# Patient Record
Sex: Male | Born: 1960 | Race: White | Hispanic: Yes | Marital: Married | Smoking: Never smoker
Health system: Southern US, Community
[De-identification: ages and names within clinical notes are randomized; demographics above are authoritative.]

## PROBLEM LIST (undated history)

## (undated) DIAGNOSIS — K469 Unspecified abdominal hernia without obstruction or gangrene: Secondary | ICD-10-CM

## (undated) DIAGNOSIS — I10 Essential (primary) hypertension: Secondary | ICD-10-CM

---

## 1998-05-24 ENCOUNTER — Ambulatory Visit (HOSPITAL_COMMUNITY): Admission: RE | Admit: 1998-05-24 | Discharge: 1998-05-24 | Payer: Self-pay | Admitting: Gastroenterology

## 2001-02-26 ENCOUNTER — Ambulatory Visit (HOSPITAL_COMMUNITY): Admission: RE | Admit: 2001-02-26 | Discharge: 2001-02-26 | Payer: Self-pay | Admitting: Gastroenterology

## 2004-08-30 ENCOUNTER — Encounter: Admission: RE | Admit: 2004-08-30 | Discharge: 2004-08-30 | Payer: Self-pay | Admitting: Internal Medicine

## 2004-10-11 ENCOUNTER — Encounter: Admission: RE | Admit: 2004-10-11 | Discharge: 2004-10-11 | Payer: Self-pay | Admitting: Internal Medicine

## 2004-10-18 ENCOUNTER — Encounter: Admission: RE | Admit: 2004-10-18 | Discharge: 2004-10-18 | Payer: Self-pay | Admitting: Internal Medicine

## 2004-11-08 ENCOUNTER — Encounter: Admission: RE | Admit: 2004-11-08 | Discharge: 2004-11-08 | Payer: Self-pay | Admitting: Internal Medicine

## 2005-04-03 ENCOUNTER — Encounter: Admission: RE | Admit: 2005-04-03 | Discharge: 2005-04-03 | Payer: Self-pay | Admitting: Internal Medicine

## 2006-01-22 IMAGING — US IR TRANSCATH EMBOLIZATION NON-NEURO EA OP FIELD
1 series · 2 of 2 positions shown · non-contrast
Comparison: none

CLINICAL DATA: Chronic right GSV venous insufficiency and reflux with chronic venous disease resulting in varicose veins, lower extremity edema, and tibial healing venous ulceration.  
ULTRASOUND GUIDED RIGHT GSV TRANSCATHETER LASER OCCLUSION:
Radiologist:  Shantel Casillas, M.D.
Guidance:  Ultrasound. 
Complications:  No immediate complications. 
Procedure/findings:  Informed consent was obtained from the patient. 
Survey ultrasound was performed for planning with the patient supine of the right lower extremity.  The entry site was localized to the lower calf region.  The right extremity was prepped and draped in the usual fashion from the inguinal ligament to the ankle.  
Under sterile conditions and local anesthesia, micropuncture needle access was performed at the right GSV along the lower most varicose venous tributary just above the ankle.  An .018 guide wire easily advanced into the right GSV along the calf.  4 French dilator was inserted followed by a straight tip .035 floppy guide wire advanced across the right SFJ without difficulty.  The 4 French delivery sheath was advanced over the guide wire and positioned 15mm inferior to the right SFJ.  Catheter position was confirmed with ultrasound.  The laser fiber was advanced through the sheath but not yet exposed to the native vein wall.  Next, local tumescent anesthesia was applied to the right GSV segment with the tumescent delivery system pump.  488cc of dilute 1% Lidocaine was instilled over the treated segment which measures 60cm.  Following anesthesia, resurvey of the segment demonstrated adequate insulation and collapse and the segment was 10mm deep to the skin.  For treatment, the laser fiber was exposed to the native vein wall and reconfirmed to be 15mm inferior to the right SFJ.  The laser fiber was enabled and 2641.3 joules of energy were delivered over 308 seconds for treatment.  The sheath and laser were removed.  Hemostasis was obtained with compression.  The patient tolerated the procedure well.  The patient was placed in a 20 to 30mm compression stocking and ambulated for 10 minutes.  He was stable for discharge.  The patient tolerated the procedure well.  There were no immediate complications.

[Series 1: unknown · 0.09mm/px · 2 of 2 slices shown]
[im 1/2]
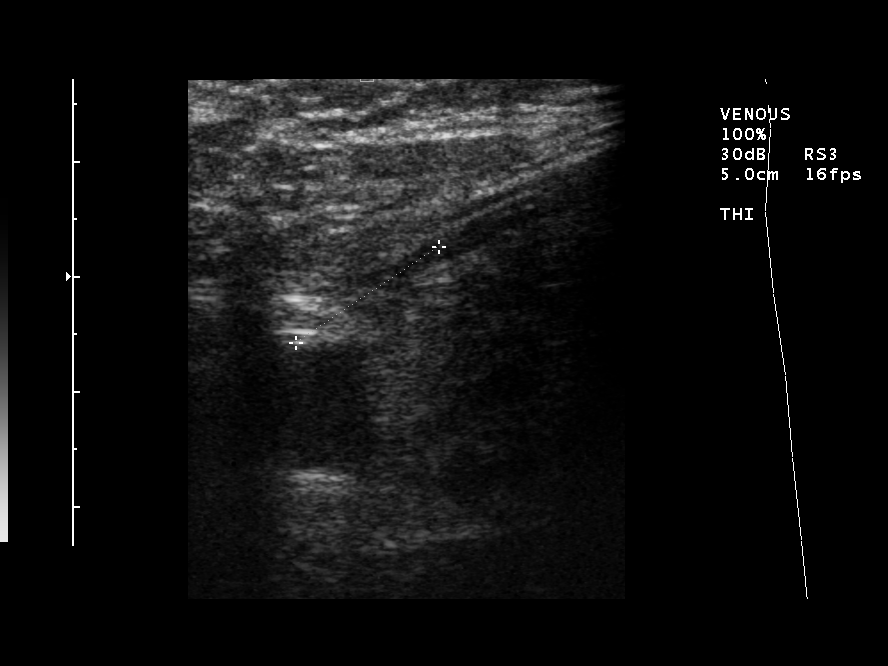
[im 2/2]
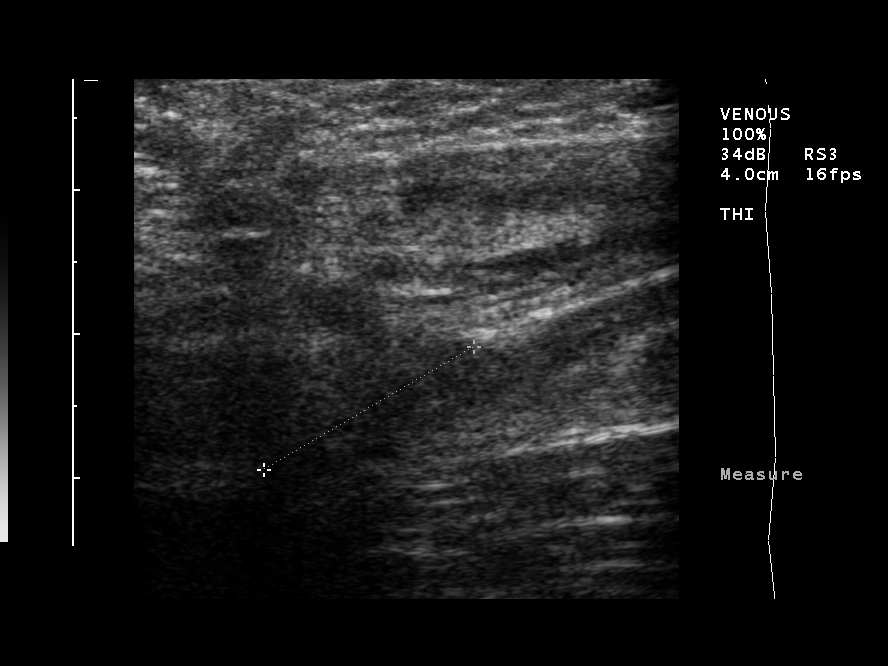

[2 of 2 positions shown; findings below may reference images not displayed]

IMPRESSION: Ultrasound guided right GSV transcatheter laser occlusion for treatment of chronic GSV venous insufficiency resulting in varicose veins, lower extremity edema, and healing venous ulceration. 
Plan:  The patient will return in one week for follow up.

## 2006-01-29 IMAGING — US EM EST PATIENT OFFICE LEVEL 3 (15 MIN)
1 series · 13 of 16 positions shown · non-contrast
Comparison: none

CLINICAL DATA: 44 year old male one week status post right GSV transcatheter laser occlusion for venous insufficiency.

[Series 1: unknown · 13 of 23 slices shown]
[im 1/23]
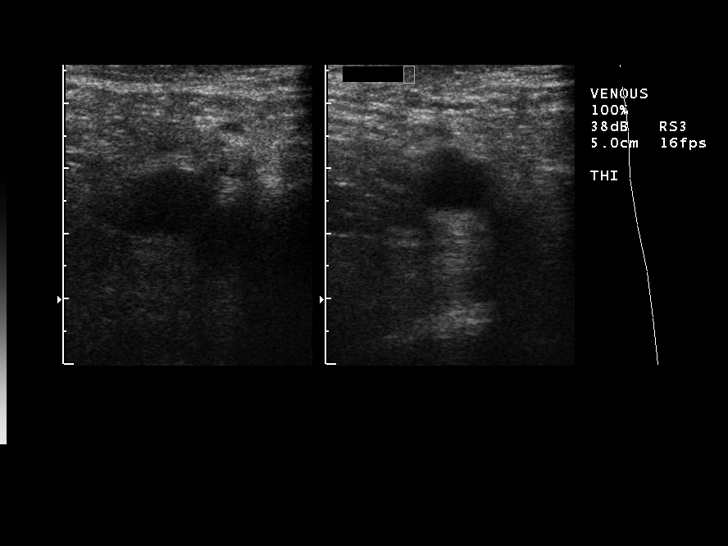
[im 2/23]
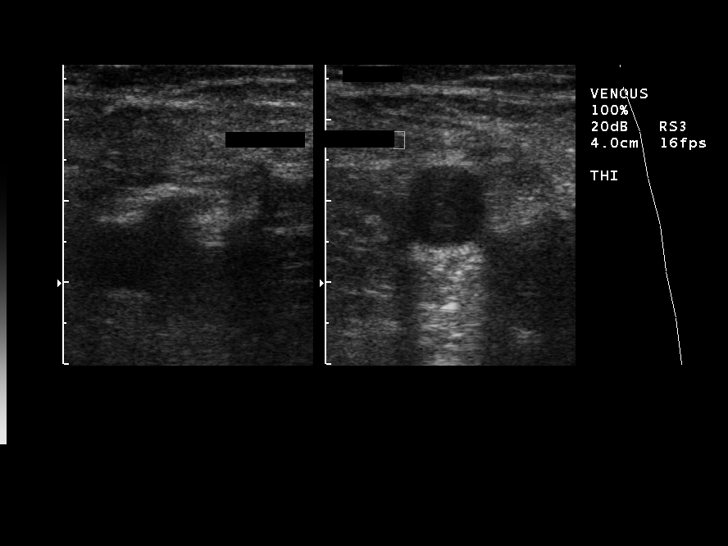
[im 5/23]
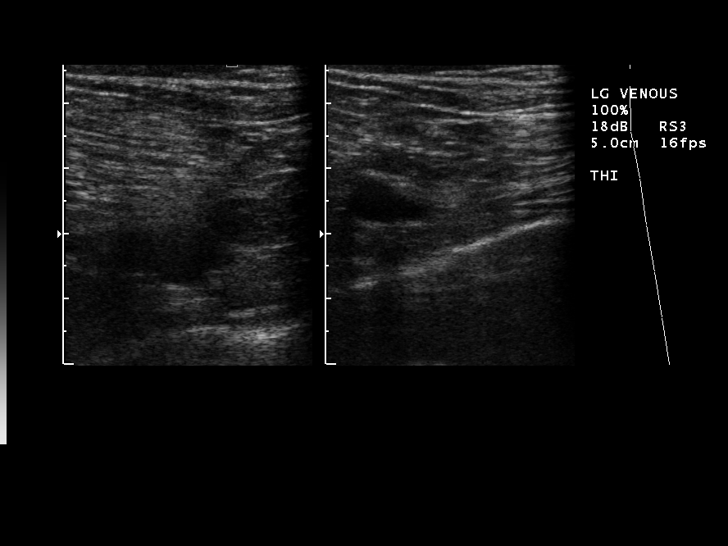
[im 6/23]
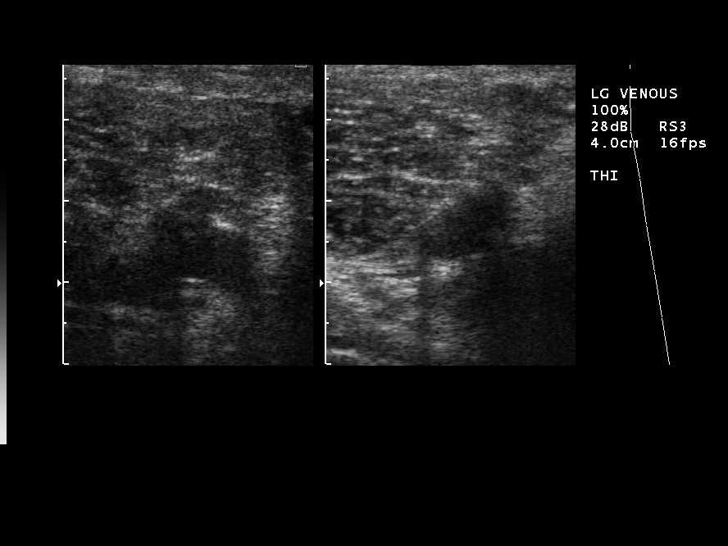
[im 8/23]
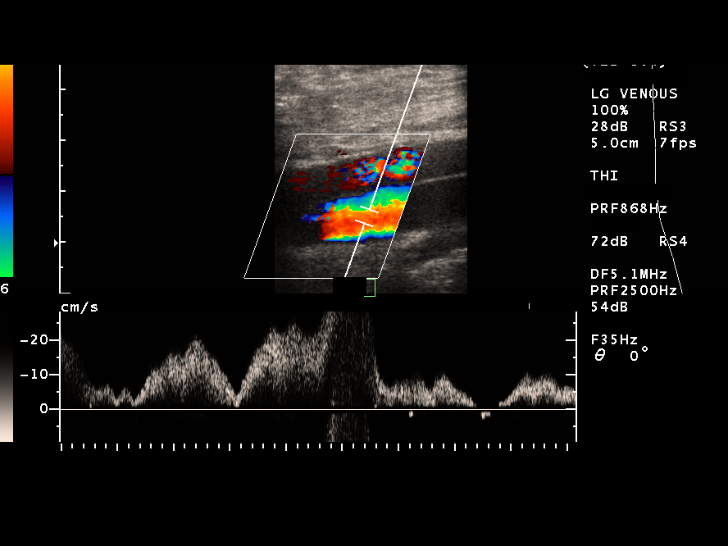
[im 9/23]
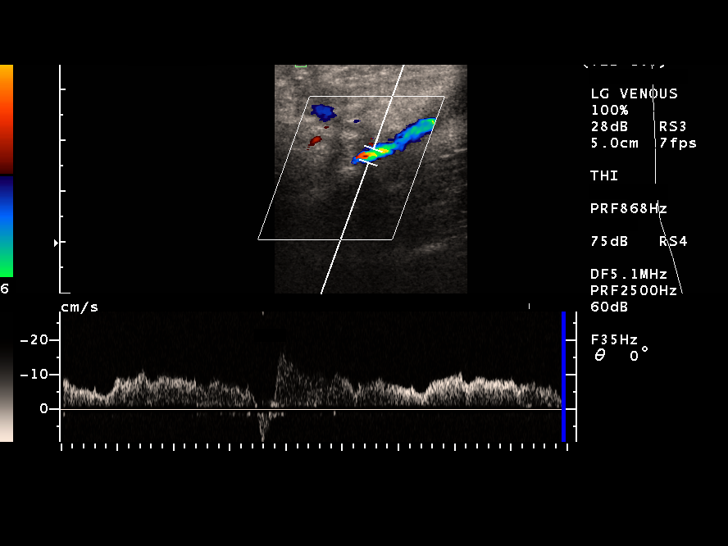
[im 12/23]
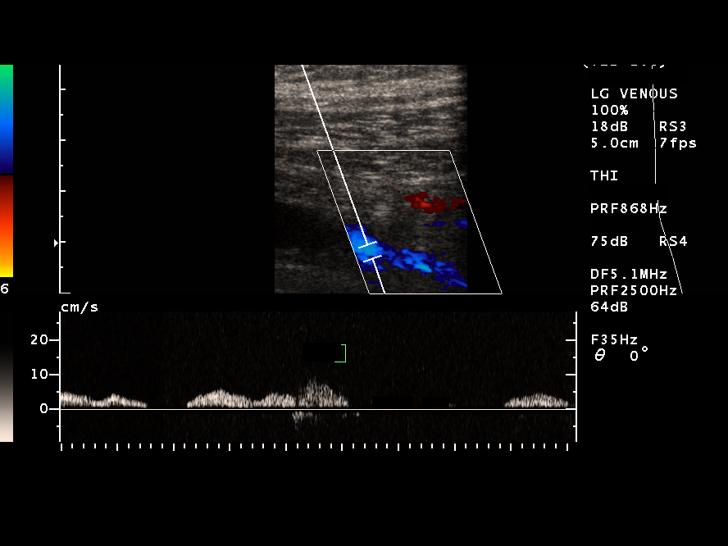
[im 14/23]
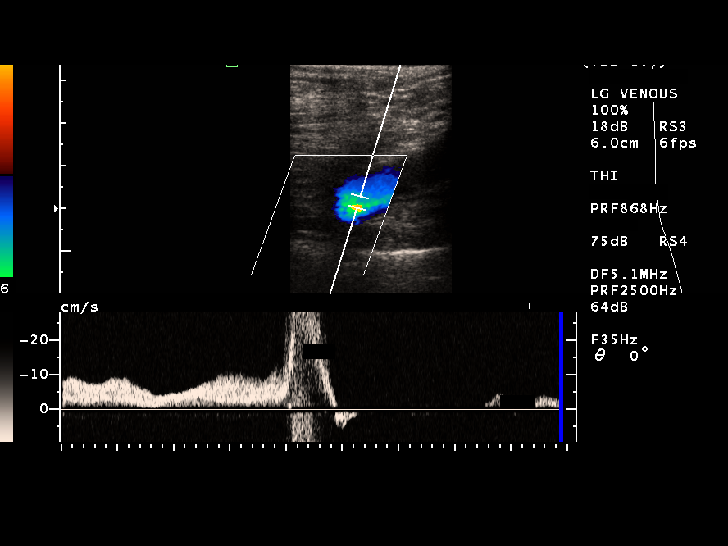
[im 15/23]
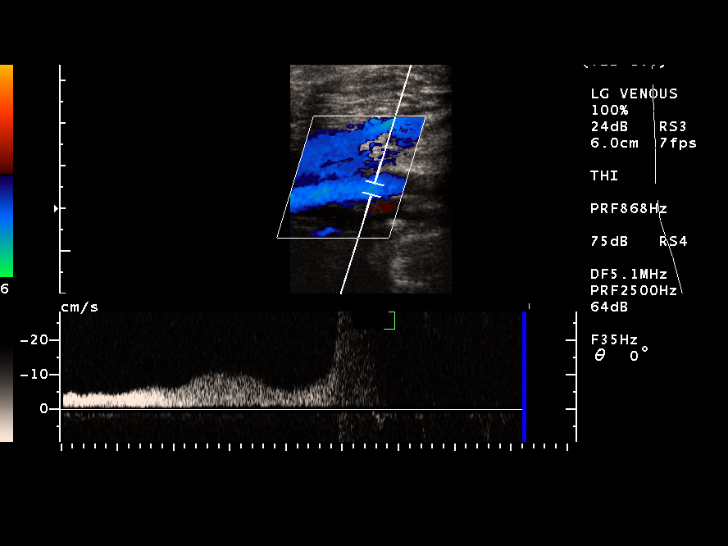
[im 17/23]
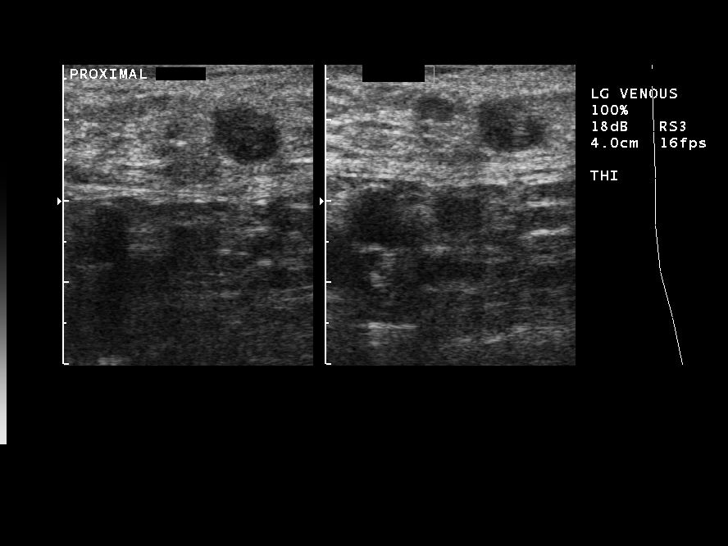
[im 18/23]
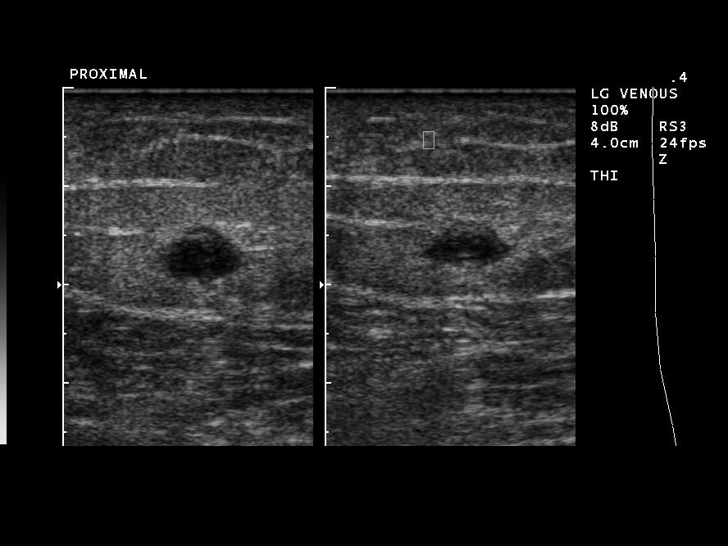
[im 21/23]
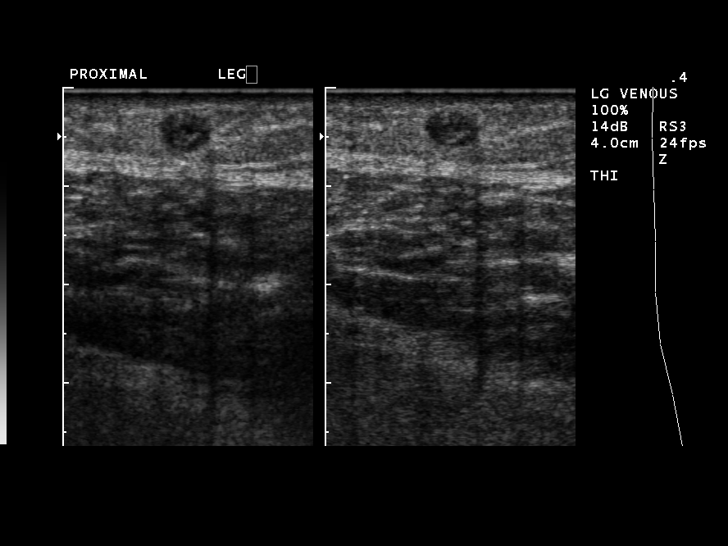
[im 23/23]
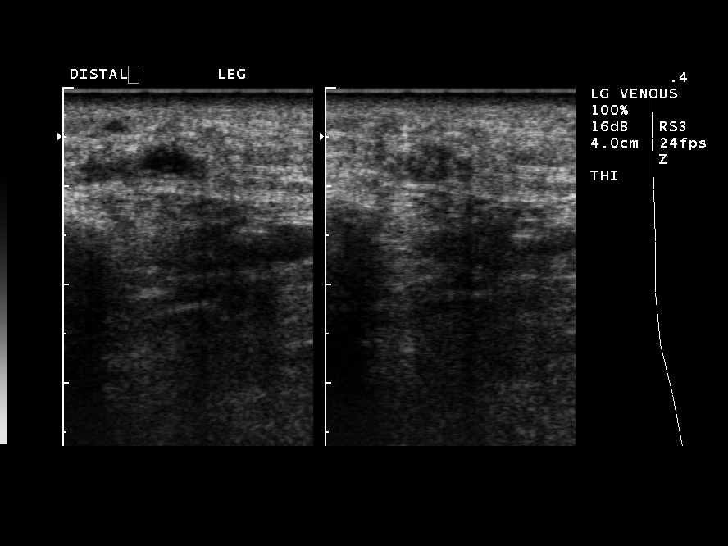

[13 of 16 positions shown; findings below may reference images not displayed]

ESTABLISHED PATIENT OFFICE VISIT ? LEVEL III ? [id number]
Today, Mr. Jalloh returns for a one week follow up visit after right GSV transcatheter laser occlusion on 10/11/04.  Over the last week he has done quite well.  He continues to wear his compression stocking.  He reports mild resolving tenderness and pain along the treated segment as would be expected at this time.  
On exam, there is minor bruising along the treated segment along the inner thigh and calf region.  There is mild tenderness focally over the incision site where there is thrombosis of the treated right GSV in the lower calf region.  This appears to be a mild associated thrombophlebitis in this region.  No definite cellulitis at this time.  The treated segment remains occluded from just inferior to the right SFJ to the lower calf region.  Overlying skin is intact.  No delayed complication.
IMPRESSION: 1.  One week status post right GSV transcatheter laser occlusion without delayed complication. 
2.  Suspect mild thrombophlebitis of the lower calf region along the treated segment.  This will be treated with seven days of Ibuprofen 600mg PO tid with meals.  
3.  He has a follow up visit in one month.

## 2006-02-19 IMAGING — US US MFM FOLLOW-UP FOCUS VISIT
1 series · 13 of 27 positions shown · non-contrast
Comparison: none

[Series 1: unknown · 13 of 27 slices shown]
[im 2/27]
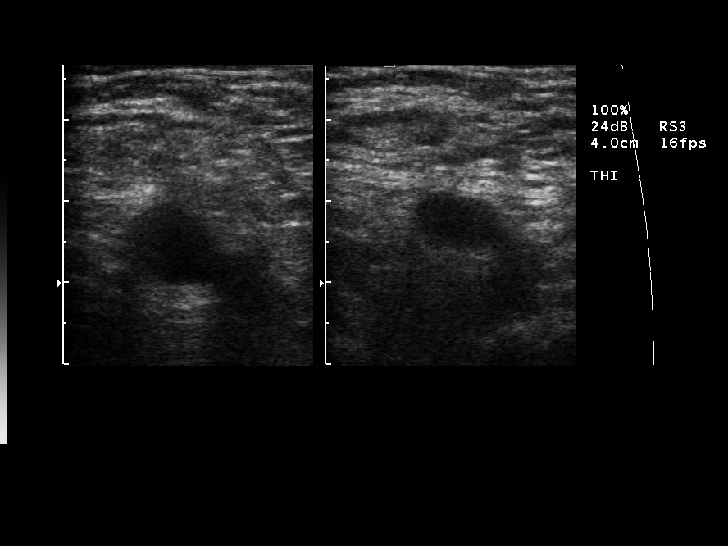
[im 4/27]
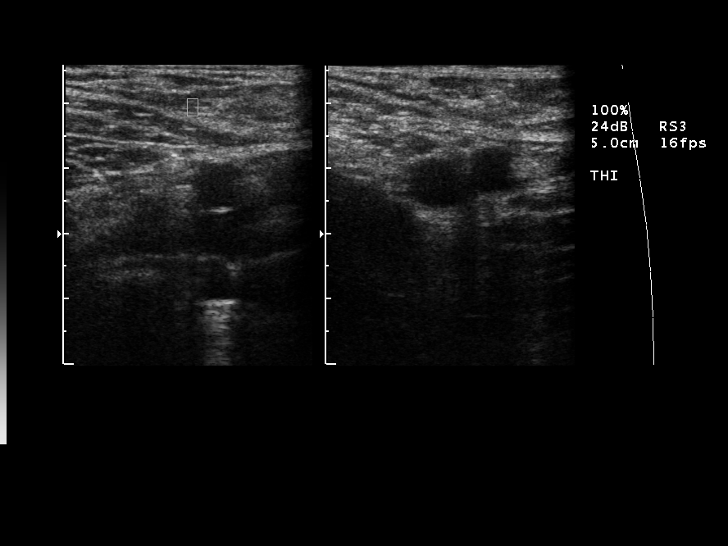
[im 6/27]
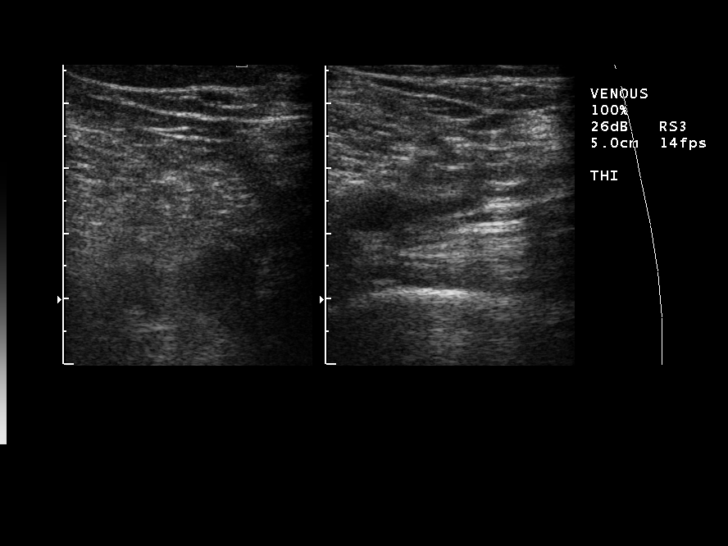
[im 8/27]
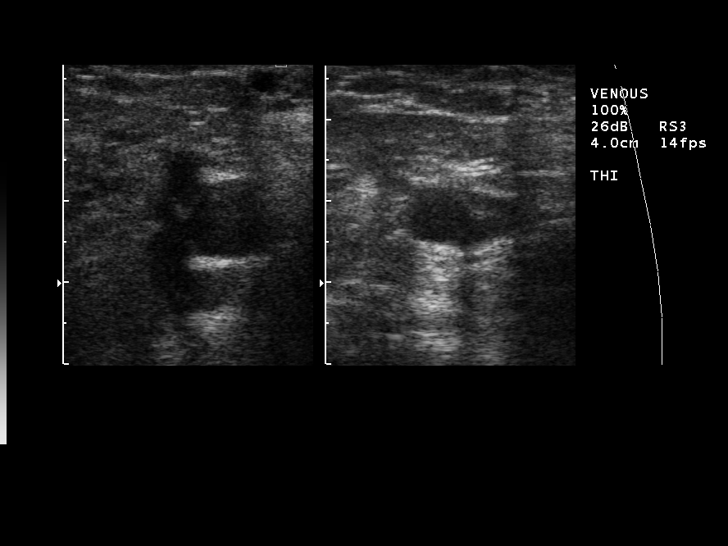
[im 10/27]
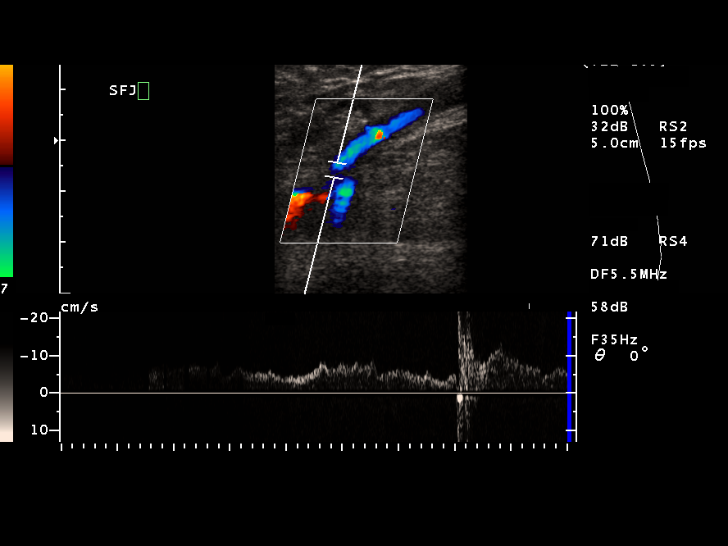
[im 12/27]
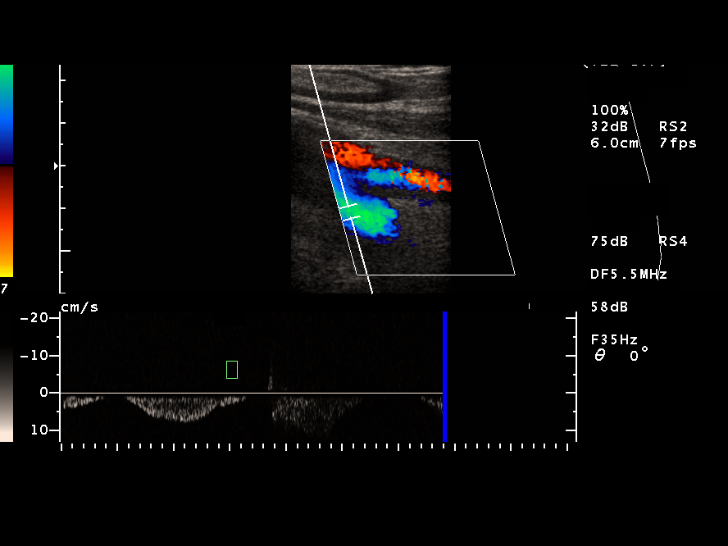
[im 14/27]
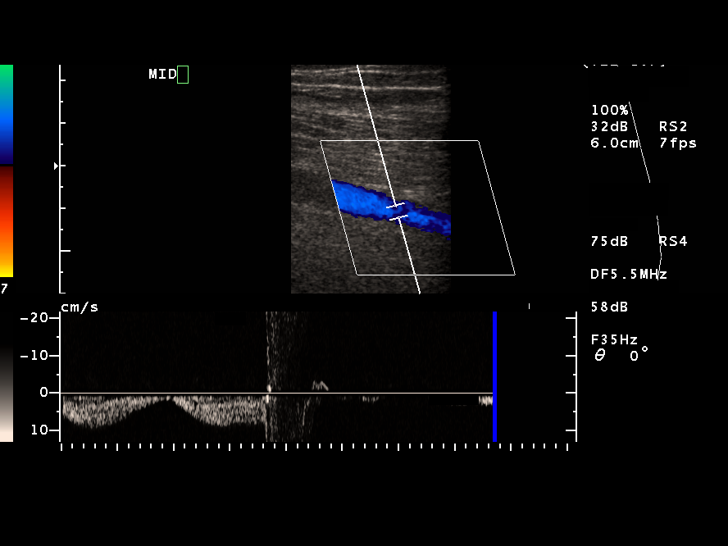
[im 16/27]
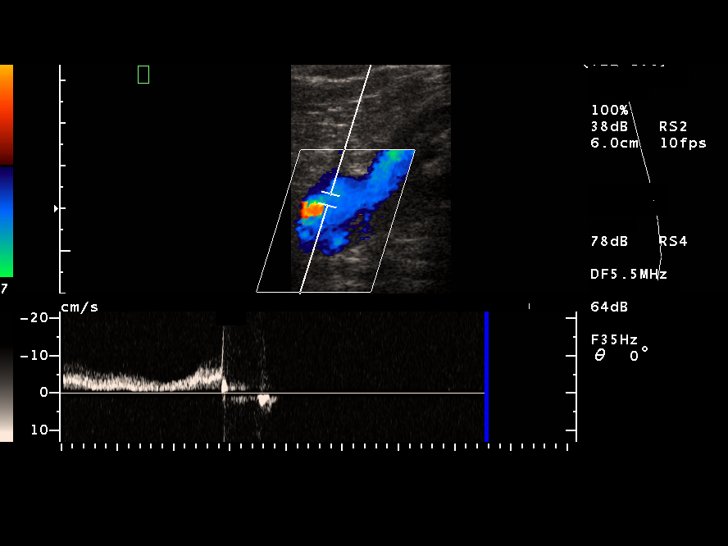
[im 18/27]
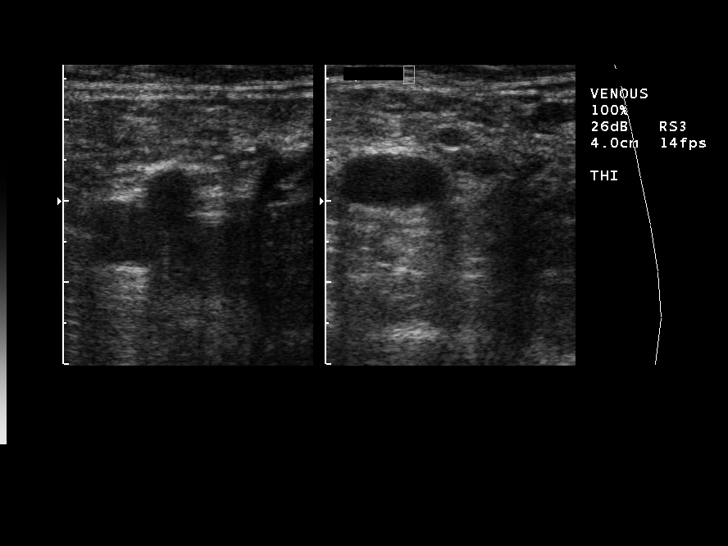
[im 20/27]
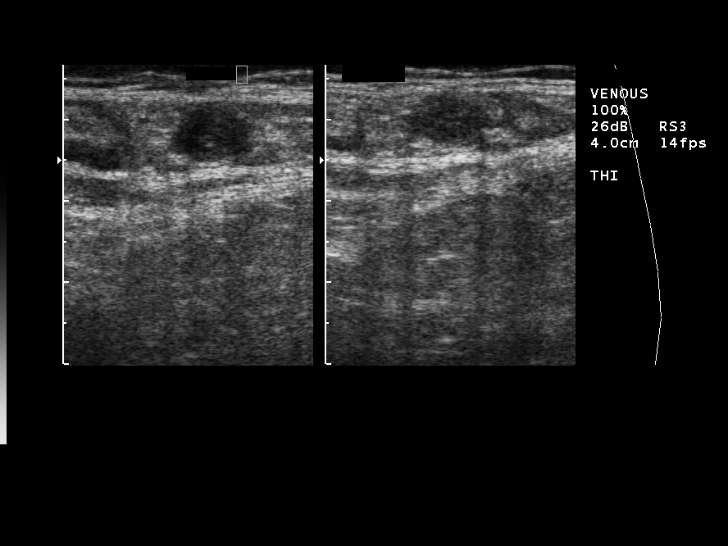
[im 22/27]
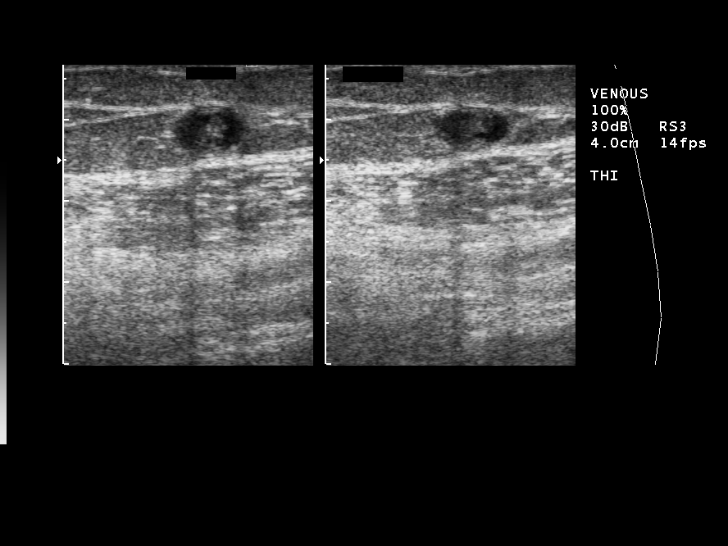
[im 24/27]
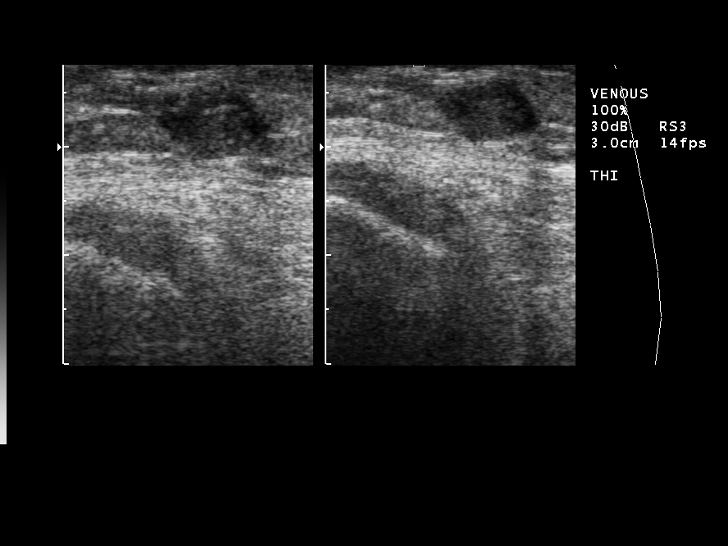
[im 26/27]
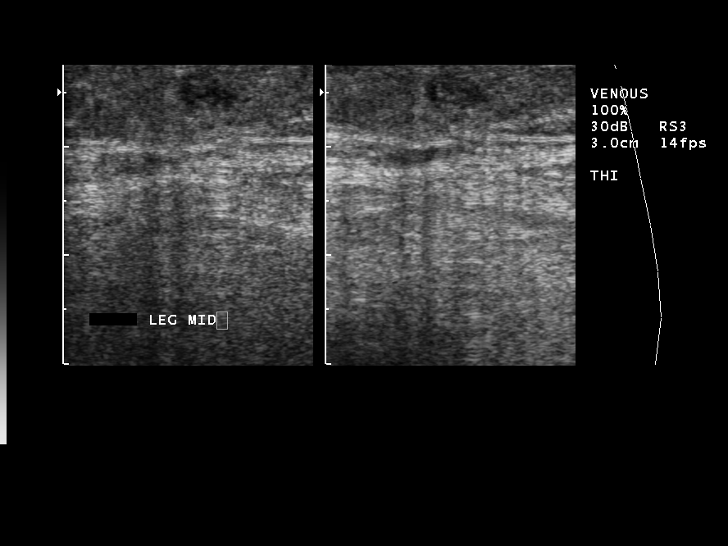

[13 of 27 positions shown; findings below may reference images not displayed]

Established patient office visit [id number]:

This 44 year old male, patient of Dr. Agirre, is seen one month status post
transcatheter laser occlusion of the right greater saphenous vein for
symptomatic varicose veins and leg swelling. He describes some improvement in
his leg swelling and pain although he does note that the symptoms persist after
a day of work despite use of graduated compression stockings. No new symptoms.
No significant pain along the treatment site.

Right leg venous ultrasound shows technically successful occlusion of the right
greater saphenous vein. No DVT or other apparent complication.

On exam, interval resolution of the bruising along the treatment course. Skin
entry site is clean, dry, intact. There is a palpable cord along the course of
the treated segment. There is persistent 1+ pretibial edema and swelling of the
right leg below the knee compared to the left. There has been partial healing of
the right leg ulcer since his initial presentation. 

My impression is that he is advancing as expected one month status post
transcatheter laser occlusion of the right greater saphenous vein. We discussed
expectation of further resolution of the palpable cord along the treatment site
and continued improvement in his right lower extremity symptoms. He will
continue the use of graduated compression stockings. We will see him back at the
a six-month followup. He knows to call should he have any interval questions or
problems.

## 2006-07-15 IMAGING — US US EXTREM LOW VENOUS*R*
1 series · 14 of 24 positions shown · non-contrast
Comparison: 11/08/04.

CLINICAL DATA: 6 month followup right GSV transcatheter laser occlusion. 
RIGHT LOWER EXTREMITY VENOUS DOPPLER ULTRASOUND:
TECHNIQUE: Gray-scale sonography with compression, as well as color and duplex Doppler ultrasound, were performed to evaluate the deep venous system from the level of the common femoral vein through the popliteal and proximal calf veins.

[Series 1: unknown · 14 of 27 slices shown]
[im 1/27]
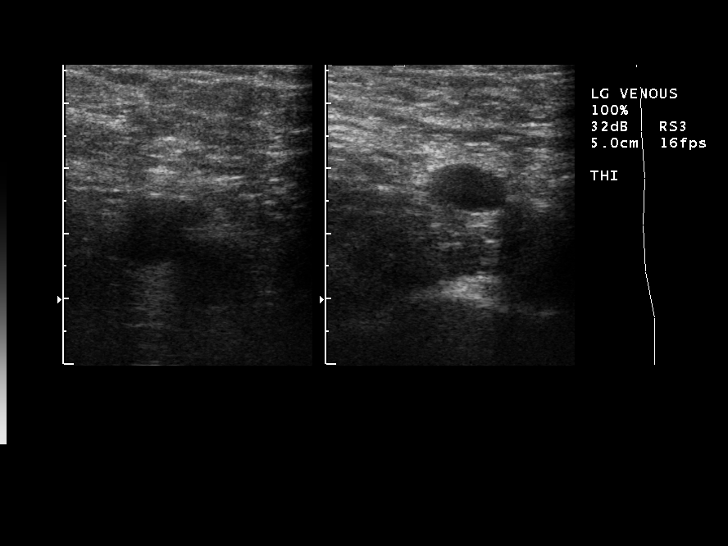
[im 3/27]
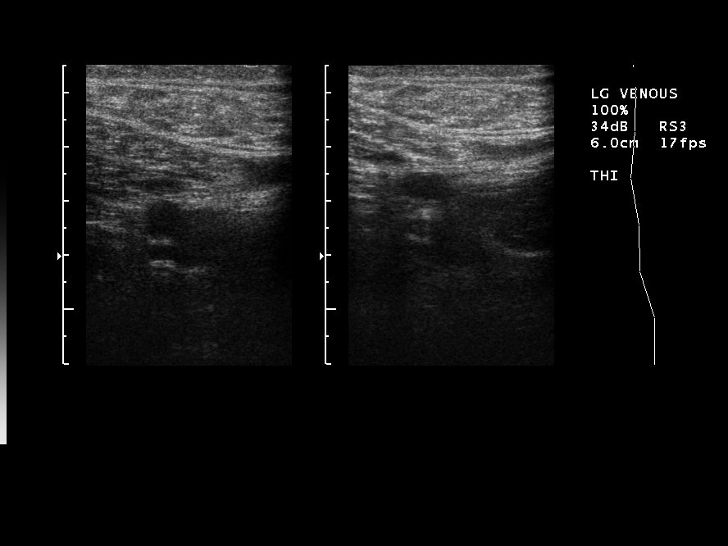
[im 5/27]
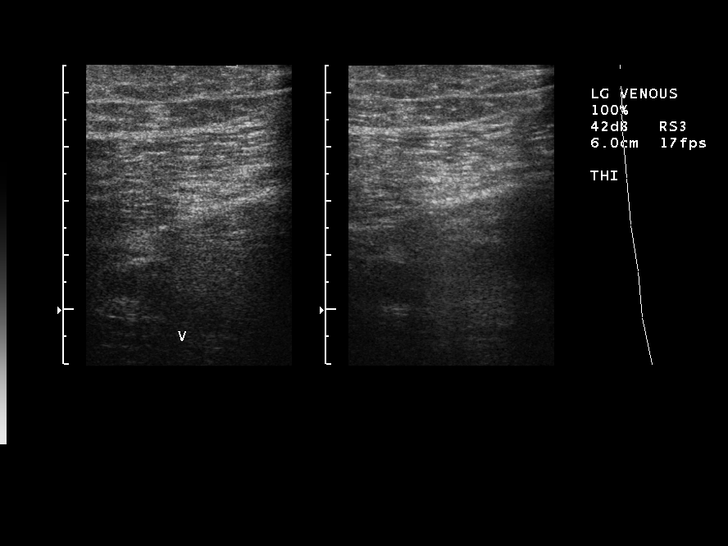
[im 7/27]
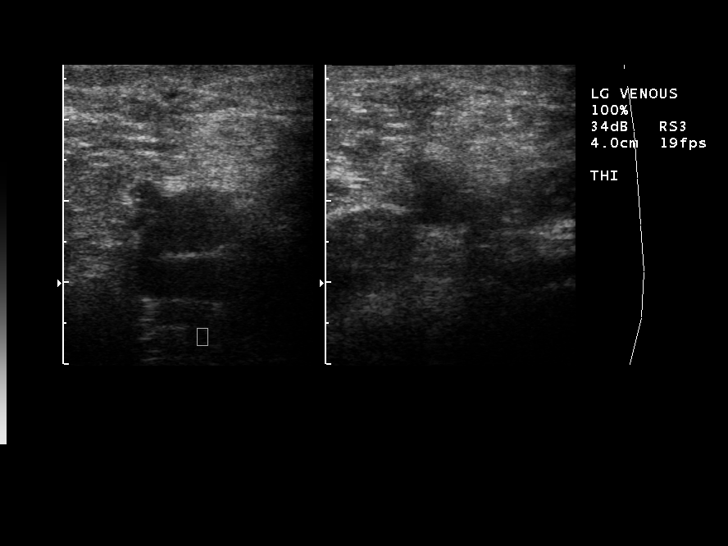
[im 8/27]
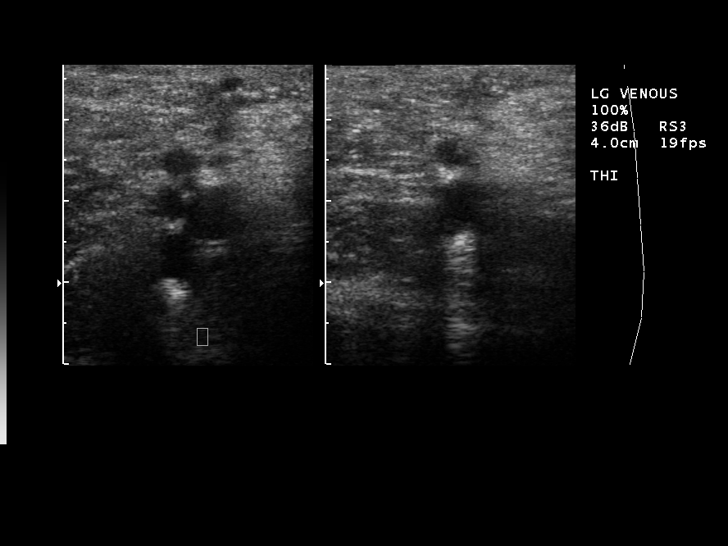
[im 11/27]
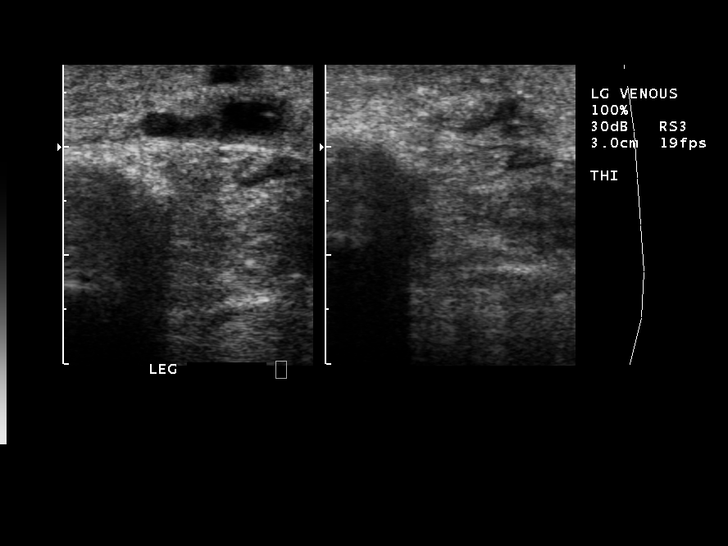
[im 13/27]
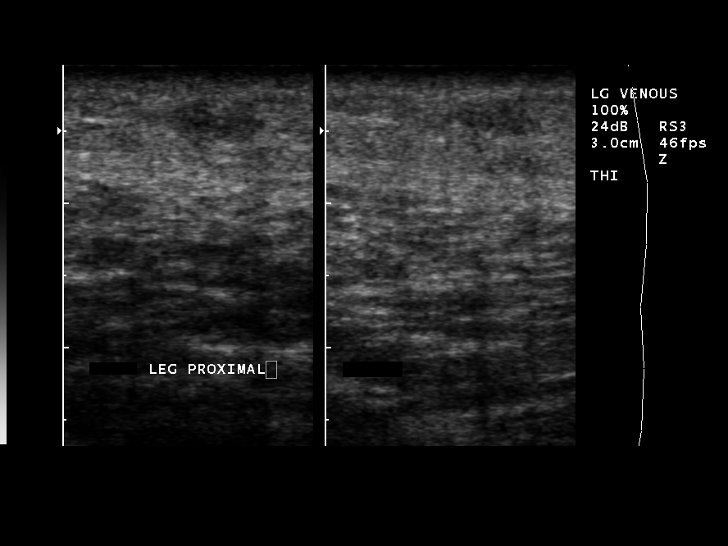
[im 14/27]
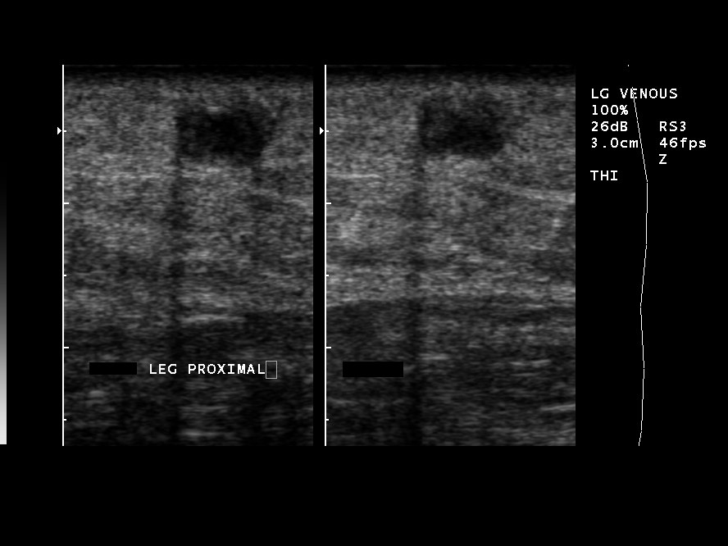
[im 16/27]
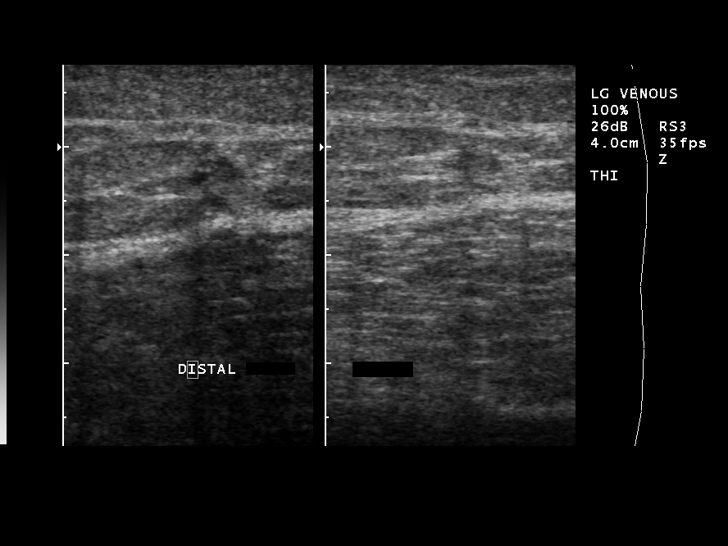
[im 19/27]
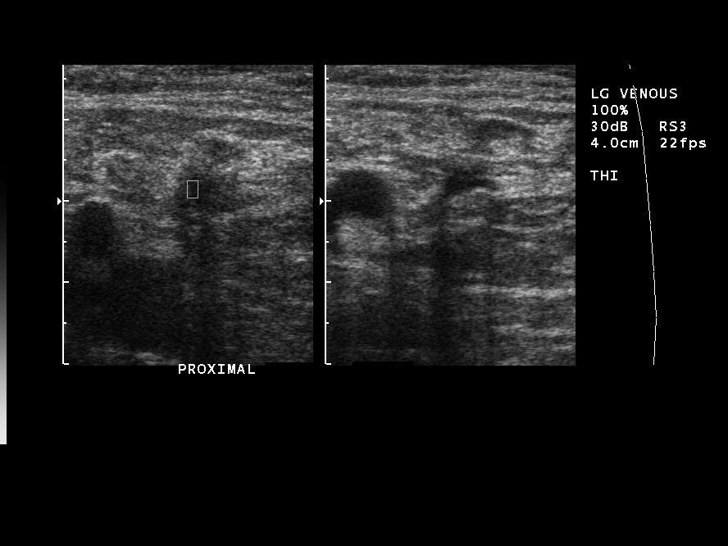
[im 21/27]
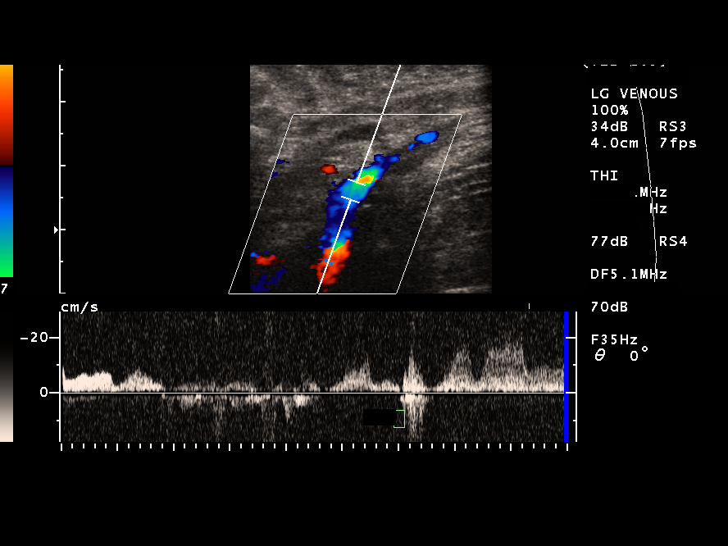
[im 22/27]
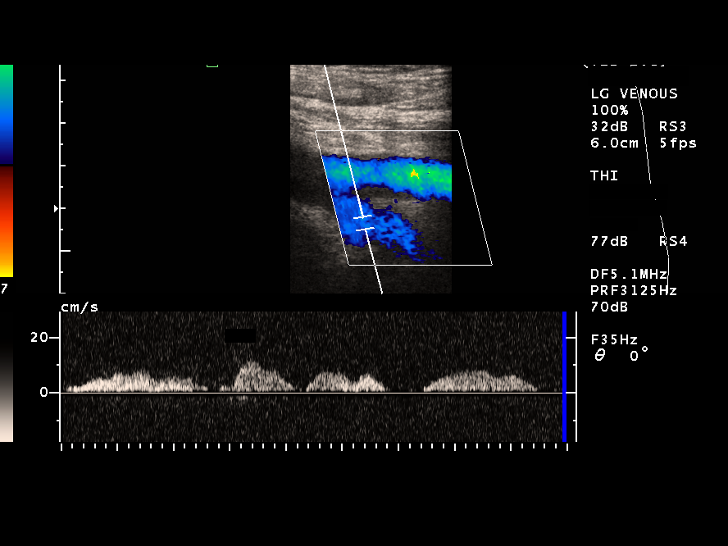
[im 24/27]
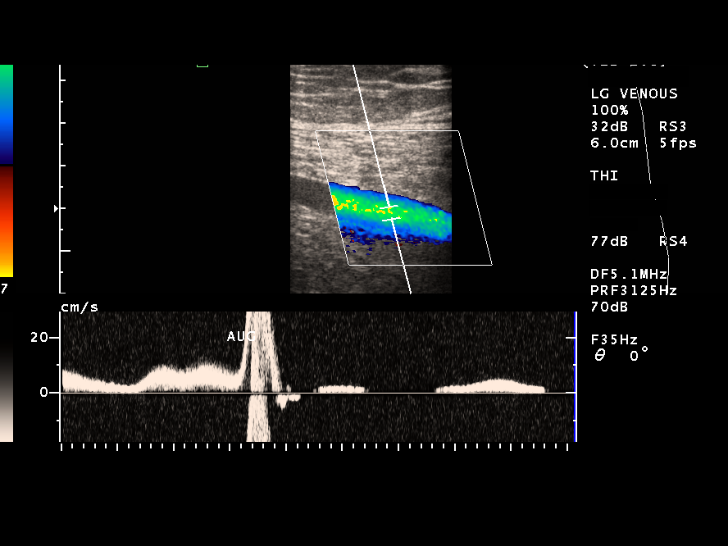
[im 27/27]
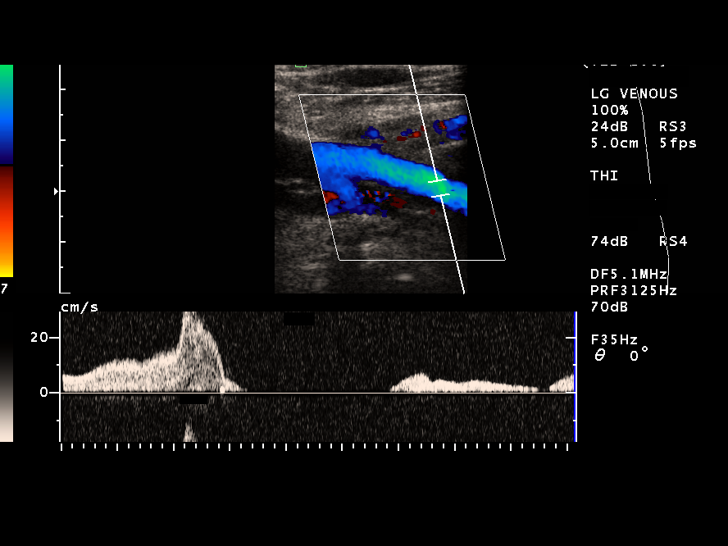

[14 of 24 positions shown; findings below may reference images not displayed]

FINDINGS: The right common femoral, femoral, and popliteal veins demonstrate normal compressibility and augmentation without DVT.  The treated segment of the right GSV remains occluded from just inferior to the right SFJ to the lower calf region.  Exam is stable in appearance.
IMPRESSION: 1.  Right GSV treated segment remains occluded.  No evidence of recanalization. 
2.  No DVT.

## 2010-02-04 ENCOUNTER — Encounter: Payer: Self-pay | Admitting: Family Medicine

## 2018-11-02 ENCOUNTER — Other Ambulatory Visit: Payer: Self-pay

## 2018-11-02 DIAGNOSIS — Z20822 Contact with and (suspected) exposure to covid-19: Secondary | ICD-10-CM

## 2018-11-04 LAB — NOVEL CORONAVIRUS, NAA: SARS-CoV-2, NAA: NOT DETECTED

## 2019-01-25 ENCOUNTER — Ambulatory Visit: Payer: Self-pay | Attending: Internal Medicine

## 2019-01-25 DIAGNOSIS — Z20822 Contact with and (suspected) exposure to covid-19: Secondary | ICD-10-CM

## 2019-01-26 LAB — NOVEL CORONAVIRUS, NAA: SARS-CoV-2, NAA: NOT DETECTED

## 2022-08-18 ENCOUNTER — Encounter (HOSPITAL_BASED_OUTPATIENT_CLINIC_OR_DEPARTMENT_OTHER): Payer: Self-pay

## 2022-08-18 ENCOUNTER — Observation Stay (HOSPITAL_BASED_OUTPATIENT_CLINIC_OR_DEPARTMENT_OTHER)
Admission: EM | Admit: 2022-08-18 | Discharge: 2022-08-20 | Disposition: A | Discharge status: Home / Self Care | Payer: BLUE CROSS/BLUE SHIELD | Attending: Surgery | Admitting: Surgery

## 2022-08-18 ENCOUNTER — Emergency Department (HOSPITAL_BASED_OUTPATIENT_CLINIC_OR_DEPARTMENT_OTHER): Payer: BLUE CROSS/BLUE SHIELD

## 2022-08-18 ENCOUNTER — Other Ambulatory Visit: Payer: Self-pay

## 2022-08-18 DIAGNOSIS — K8000 Calculus of gallbladder with acute cholecystitis without obstruction: Secondary | ICD-10-CM | POA: Diagnosis present

## 2022-08-18 DIAGNOSIS — K8012 Calculus of gallbladder with acute and chronic cholecystitis without obstruction: Secondary | ICD-10-CM | POA: Diagnosis not present

## 2022-08-18 DIAGNOSIS — I10 Essential (primary) hypertension: Secondary | ICD-10-CM | POA: Insufficient documentation

## 2022-08-18 DIAGNOSIS — K8020 Calculus of gallbladder without cholecystitis without obstruction: Principal | ICD-10-CM

## 2022-08-18 DIAGNOSIS — K810 Acute cholecystitis: Secondary | ICD-10-CM | POA: Diagnosis present

## 2022-08-18 DIAGNOSIS — R1011 Right upper quadrant pain: Secondary | ICD-10-CM | POA: Diagnosis present

## 2022-08-18 HISTORY — DX: Unspecified abdominal hernia without obstruction or gangrene: K46.9

## 2022-08-18 HISTORY — DX: Essential (primary) hypertension: I10

## 2022-08-18 LAB — URINALYSIS, ROUTINE W REFLEX MICROSCOPIC
Bilirubin Urine: NEGATIVE
Glucose, UA: NEGATIVE mg/dL
Ketones, ur: NEGATIVE mg/dL
Leukocytes,Ua: NEGATIVE
Nitrite: NEGATIVE
Protein, ur: 100 mg/dL — AB
Specific Gravity, Urine: 1.025 (ref 1.005–1.030)
pH: 7 (ref 5.0–8.0)

## 2022-08-18 LAB — CBC
HCT: 45.4 % (ref 39.0–52.0)
Hemoglobin: 15.9 g/dL (ref 13.0–17.0)
MCH: 29.9 pg (ref 26.0–34.0)
MCHC: 35 g/dL (ref 30.0–36.0)
MCV: 85.3 fL (ref 80.0–100.0)
Platelets: 192 10*3/uL (ref 150–400)
RBC: 5.32 MIL/uL (ref 4.22–5.81)
RDW: 13.1 % (ref 11.5–15.5)
WBC: 14.4 10*3/uL — ABNORMAL HIGH (ref 4.0–10.5)
nRBC: 0 % (ref 0.0–0.2)

## 2022-08-18 LAB — COMPREHENSIVE METABOLIC PANEL
ALT: 47 U/L — ABNORMAL HIGH (ref 0–44)
AST: 43 U/L — ABNORMAL HIGH (ref 15–41)
Albumin: 4.6 g/dL (ref 3.5–5.0)
Alkaline Phosphatase: 92 U/L (ref 38–126)
Anion gap: 14 (ref 5–15)
BUN: 19 mg/dL (ref 8–23)
CO2: 24 mmol/L (ref 22–32)
Calcium: 9 mg/dL (ref 8.9–10.3)
Chloride: 100 mmol/L (ref 98–111)
Creatinine, Ser: 0.89 mg/dL (ref 0.61–1.24)
GFR, Estimated: >60 mL/min (ref >60)
Glucose, Bld: 166 mg/dL — ABNORMAL HIGH (ref 70–99)
Potassium: 3.5 mmol/L (ref 3.5–5.1)
Sodium: 138 mmol/L (ref 135–145)
Total Bilirubin: 0.9 mg/dL (ref 0.3–1.2)
Total Protein: 8.1 g/dL (ref 6.5–8.1)

## 2022-08-18 LAB — TROPONIN I (HIGH SENSITIVITY)
Troponin I (High Sensitivity): 6 ng/L (ref <18)
Troponin I (High Sensitivity): 7 ng/L (ref <18)

## 2022-08-18 LAB — URINALYSIS, MICROSCOPIC (REFLEX)

## 2022-08-18 LAB — LIPASE, BLOOD: Lipase: 27 U/L (ref 11–51)

## 2022-08-18 MED ORDER — HYDROMORPHONE HCL 1 MG/ML IJ SOLN
0.5000 mg | Freq: Once | INTRAMUSCULAR | Status: AC
  Administered 2022-08-18: 0.5 mg via INTRAVENOUS
  Filled 2022-08-18: qty 1

## 2022-08-18 MED ORDER — MAGNESIUM SULFATE 2 GM/50ML IV SOLN
2.0000 g | INTRAVENOUS | Status: AC
  Administered 2022-08-18: 2 g via INTRAVENOUS
  Filled 2022-08-18: qty 50

## 2022-08-18 MED ORDER — IOHEXOL 300 MG/ML  SOLN
100.0000 mL | Freq: Once | INTRAMUSCULAR | Status: AC | PRN
  Administered 2022-08-18: 100 mL via INTRAVENOUS

## 2022-08-18 MED ORDER — KCL IN DEXTROSE-NACL 20-5-0.9 MEQ/L-%-% IV SOLN
INTRAVENOUS | Status: DC
  Filled 2022-08-18 (×3): qty 1000

## 2022-08-18 MED ORDER — FENTANYL CITRATE PF 50 MCG/ML IJ SOSY
50.0000 ug | PREFILLED_SYRINGE | Freq: Once | INTRAMUSCULAR | Status: AC
  Administered 2022-08-18: 50 ug via INTRAVENOUS
  Filled 2022-08-18: qty 1

## 2022-08-18 MED ORDER — ONDANSETRON HCL 4 MG/2ML IJ SOLN
4.0000 mg | Freq: Four times a day (QID) | INTRAMUSCULAR | Status: DC | PRN
  Administered 2022-08-18: 4 mg via INTRAVENOUS
  Filled 2022-08-18: qty 2

## 2022-08-18 MED ORDER — ONDANSETRON HCL 4 MG/2ML IJ SOLN
4.0000 mg | Freq: Once | INTRAMUSCULAR | Status: AC
  Administered 2022-08-18: 4 mg via INTRAVENOUS
  Filled 2022-08-18: qty 2

## 2022-08-18 MED ORDER — SODIUM CHLORIDE 0.9 % IV BOLUS
1000.0000 mL | Freq: Once | INTRAVENOUS | Status: AC
  Administered 2022-08-18: 1000 mL via INTRAVENOUS

## 2022-08-18 MED ORDER — ALUM & MAG HYDROXIDE-SIMETH 200-200-20 MG/5ML PO SUSP: 30.0000 mL | Freq: Once | ORAL | Status: DC

## 2022-08-18 MED ORDER — HYDROMORPHONE HCL 1 MG/ML IJ SOLN
1.0000 mg | INTRAMUSCULAR | Status: DC | PRN
  Administered 2022-08-18 – 2022-08-19 (×4): 1 mg via INTRAVENOUS
  Filled 2022-08-18 (×4): qty 1

## 2022-08-18 MED ORDER — ENOXAPARIN SODIUM 40 MG/0.4ML IJ SOSY
40.0000 mg | PREFILLED_SYRINGE | INTRAMUSCULAR | Status: DC
  Administered 2022-08-18: 40 mg via SUBCUTANEOUS
  Filled 2022-08-18: qty 0.4

## 2022-08-18 MED ORDER — LIDOCAINE VISCOUS HCL 2 % MT SOLN: 15.0000 mL | Freq: Once | OROMUCOSAL | Status: DC

## 2022-08-18 MED ORDER — METOPROLOL TARTRATE 5 MG/5ML IV SOLN: 5.0000 mg | Freq: Four times a day (QID) | INTRAVENOUS | Status: DC | PRN

## 2022-08-18 MED ORDER — ONDANSETRON 4 MG PO TBDP: 4.0000 mg | ORAL_TABLET | Freq: Four times a day (QID) | ORAL | Status: DC | PRN

## 2022-08-18 MED ORDER — SODIUM CHLORIDE 0.9 % IV SOLN
2.0000 g | INTRAVENOUS | Status: DC
  Administered 2022-08-18: 2 g via INTRAVENOUS
  Filled 2022-08-18: qty 20

## 2022-08-18 NOTE — ED Provider Notes (Signed)
[facility] Provider Note   CSN: [id number] Arrival date & time: 08/18/22  6644     History  Chief Complaint  Patient presents with   Abdominal Pain   Emesis    Marcus Wells is a 62 y.o. male  Pt reports onset of abdominal pain last night after eating and drinking a beer. He began having nausea and vomiting. He feels urgency to defecate but states nothing is coming out. He has been able to pass gas. He denies chest pain or urinary sxs.  The history is provided by the patient and the spouse.  Abdominal Pain Pain location:  RUQ, epigastric and RLQ Pain quality: aching, bloating and cramping   Pain radiates to:  Does not radiate Pain severity:  Severe Onset quality:  Gradual Duration: began last night. Timing:  Constant Progression:  Worsening Chronicity:  New Context: not alcohol use, not awakening from sleep, not diet changes, not eating, not laxative use, not medication withdrawal, not previous surgeries, not recent illness, not recent sexual activity, not recent travel, not retching, not sick contacts, not suspicious food intake and not trauma   Context comment:  Occured after eating last night and drinking a beer Relieved by:  Nothing Worsened by:  Nothing Ineffective treatments: warm tea. Associated symptoms: anorexia, nausea and vomiting   Associated symptoms: no belching, no chest pain, no constipation, no diarrhea, no dysuria, no fatigue, no fever, no flatus, no hematemesis, no hematochezia, no hematuria, no melena, no shortness of breath and no sore throat   Emesis Associated symptoms: abdominal pain   Associated symptoms: no diarrhea, no fever and no sore throat        Home Medications Prior to Admission medications   Not on File      Allergies    Patient has no known allergies.    Review of Systems   Review of Systems  Constitutional:  Negative for fatigue and fever.  HENT:  Negative for sore throat.    Respiratory:  Negative for shortness of breath.   Cardiovascular:  Negative for chest pain.  Gastrointestinal:  Positive for abdominal pain, anorexia, nausea and vomiting. Negative for constipation, diarrhea, flatus, hematemesis, hematochezia and melena.  Genitourinary:  Negative for dysuria and hematuria.    Physical Exam Updated Vital Signs BP (!) 179/95   Pulse 75   Temp 98.7 F (37.1 C) (Oral)   Resp 16   Ht 5\' 6"  (1.676 m)   Wt 102.1 kg   SpO2 93%   BMI 36.32 kg/m  Physical Exam Vitals and nursing note reviewed.  Constitutional:      General: He is not in acute distress.    Appearance: He is well-developed. He is not diaphoretic.  HENT:     Head: Normocephalic and atraumatic.  Eyes:     General: No scleral icterus.    Conjunctiva/sclera: Conjunctivae normal.  Cardiovascular:     Rate and Rhythm: Normal rate and regular rhythm.     Heart sounds: Normal heart sounds.  Pulmonary:     Effort: Pulmonary effort is normal. No respiratory distress.     Breath sounds: Normal breath sounds.  Abdominal:     Palpations: Abdomen is soft.     Tenderness: There is abdominal tenderness in the right upper quadrant. There is no right CVA tenderness or left CVA tenderness. Negative signs include Murphy's sign.  Musculoskeletal:     Cervical back: Normal range of motion and neck supple.  Skin:  General: Skin is warm and dry.  Neurological:     Mental Status: He is alert.  Psychiatric:        Behavior: Behavior normal.     ED Results / Procedures / Treatments   Labs (all labs ordered are listed, but only abnormal results are displayed) Labs Reviewed  LIPASE, BLOOD  COMPREHENSIVE METABOLIC PANEL  CBC  URINALYSIS, ROUTINE W REFLEX MICROSCOPIC  TROPONIN I (HIGH SENSITIVITY)    EKG None  Radiology No results found.  Procedures Procedures    Medications Ordered in ED Medications - No data to display  ED Course/ Medical Decision Making/ A&P Clinical Course as of  08/18/22 1702  Sun Aug 18, 2022  1047 WBC(!): 14.4 [AH]  1048 Glucose(!): 166 [AH]  1048 AST(!): 43 [AH]  1048 ALT(!): 47 [AH]  1100 Urinalysis, Microscopic (reflex)(!) [AH]  1100 Urinalysis, Routine w reflex microscopic -Urine, Clean Catch(!) [AH]  1106 I reviewed the patient's EKG which shows sinus rhythm at a rate of 79 with prolonged QT of 575.  I have ordered magnesium. [AH]  1107 Troponin I (High Sensitivity) [AH]  1200 CT ABDOMEN PELVIS W CONTRAST [AH]  1536 Spoke with Dr. Luisa Hart who will examine the patient, plan to follow [CP]    Clinical Course User Index [AH] Arthor Captain, PA-C [CP] Olene Floss PA-C                                 Medical Decision Making 62 year old male who presents emergency department chief complaint of epigastric abdominal pain nausea and vomiting.Differential diagnosis of epigastric pain includes: Functional or nonulcer dyspepsia PUD, GERD, Gastritis, (NSAIDs, alcohol, stress, H. pylori, pernicious anemia), pancreatitis or pancreatic cancer, overeating indigestion (high-fat foods, coffee), drugs (aspirin, antibiotics (eg, macrolides, metronidazole), corticosteroids, digoxin, narcotics, theophylline), gastroparesis, lactose intolerance, malabsorption gastric cancer, parasitic infection, (Giardia, Strongyloides, Ascaris) cholelithiasis, choledocholithiasis, or cholangitis, ACS, pericarditis, pneumonia, abdominal hernia, pregnancy, intestinal ischemia, esophageal rupture, gastric volvulus, hepatitis.  I visualized and interpreted patient's labs which show bated blood glucose at 166, CBC with mildly elevated white blood cell count likely acute phase reaction.  Urine without evidence of infection.  Patient's troponin is negative x 2. I visualized and interpreted CT scan of the abdomen pelvis which showed no acute findings.  Then visualized and interpreted an ultrasound of the right upper quadrant which shows multiple evidence of gallstones.  No  evidence of acute cholecystitis.  Patient given multiple rounds of pain medications without significant control of his pain.  Suspect symptomatic gallstones.  Discussed with Dr. Luisa Hart who asked that the patient be transferred to the ED at [facility].  Amount and/or Complexity of Data Reviewed Labs: ordered. Decision-making details documented in ED Course. Radiology: ordered. Decision-making details documented in ED Course. ECG/medicine tests: ordered.  Risk Prescription drug management. Decision regarding hospitalization.           Final Clinical Impression(s) / ED Diagnoses Final diagnoses:  None    Rx / DC Orders ED Discharge Orders     None         Arthor Captain, PA-C 08/18/22 1830    Alvira Monday, MD 08/18/22 2130

## 2022-08-18 NOTE — ED Notes (Signed)
Patients sp02 was 88% while sleeping. I placed him on 3lpm and sat the head of his bed up. Now 96%.

## 2022-08-18 NOTE — ED Notes (Signed)
Patient arrived via [facility].

## 2022-08-18 NOTE — ED Notes (Signed)
ED TO INPATIENT HANDOFF REPORT  Name/Age/Gender Marcus Wells 62 y.o. male  Code Status    Code Status Orders  (From admission, onward)           Start     Ordered   08/18/22 1706  Full code  Continuous       Question:  By:  Answer:  Other   08/18/22 1705           Code Status History     This patient has a current code status but no historical code status.       Home/SNF/Other Home  Chief Complaint Acute cholecystitis [K81.0]  Level of Care/Admitting Diagnosis ED Disposition     ED Disposition  Admit   Condition  --   Comment  Hospital Area: [facility] [[id number]]  Level of Care: Med-Surg [16]  May admit patient to [facility] or [facility] if equivalent level of care is available:: No  Covid Evaluation: Asymptomatic - no recent exposure (last 10 days) testing not required  Diagnosis: Acute cholecystitis [575.0.ICD-9-CM]  Admitting Physician: CCS, MD [[id number]]  Attending Physician: CCS, MD [phone]  Certification:: I certify this patient will need inpatient services for at least 2 midnights          Medical History Past Medical History:  Diagnosis Date   Hernia, abdominal    Hypertension     Allergies No Known Allergies  IV Location/Drains/Wounds Patient Lines/Drains/Airways Status     Active Line/Drains/Airways     Name Placement date Placement time Site Days   Peripheral IV 08/18/22 20 G 1" Anterior;Distal;Right;Upper Antecubital 08/18/22  1116  Antecubital  less than 1            Labs/Imaging Results for orders placed or performed during the hospital encounter of 08/18/22 (from the past 48 hour(s))  Lipase, blood     Status: None   Collection Time: 08/18/22 10:00 AM  Result Value Ref Range   Lipase 27 11 - 51 U/L    Comment: Performed at [facility], [address] [place], [place] [address]  Comprehensive metabolic panel     Status: Abnormal   Collection Time: 08/18/22 10:00 AM  Result Value  Ref Range   Sodium 138 135 - 145 mmol/L   Potassium 3.5 3.5 - 5.1 mmol/L   Chloride 100 98 - 111 mmol/L   CO2 24 22 - 32 mmol/L   Glucose, Bld 166 (H) 70 - 99 mg/dL    Comment: Glucose reference range applies only to samples taken after fasting for at least 8 hours.   BUN 19 8 - 23 mg/dL   Creatinine, Ser 4.09 0.61 - 1.24 mg/dL   Calcium 9.0 8.9 - 81.1 mg/dL   Total Protein 8.1 6.5 - 8.1 g/dL   Albumin 4.6 3.5 - 5.0 g/dL   AST 43 (H) 15 - 41 U/L   ALT 47 (H) 0 - 44 U/L   Alkaline Phosphatase 92 38 - 126 U/L   Total Bilirubin 0.9 0.3 - 1.2 mg/dL   GFR, Estimated >91 >47 mL/min    Comment: (NOTE) Calculated using the CKD-EPI Creatinine Equation (2021)    Anion gap 14 5 - 15    Comment: Performed at [facility], [address] [place], [place] [address]  CBC     Status: Abnormal   Collection Time: 08/18/22 10:00 AM  Result Value Ref Range   WBC 14.4 (H) 4.0 - 10.5 K/uL  RBC 5.32 4.22 - 5.81 MIL/uL   Hemoglobin 15.9 13.0 - 17.0 g/dL   HCT 16.1 09.6 - 04.5 %   MCV 85.3 80.0 - 100.0 fL   MCH 29.9 26.0 - 34.0 pg   MCHC 35.0 30.0 - 36.0 g/dL   RDW 40.9 81.1 - 91.4 %   Platelets 192 150 - 400 K/uL   nRBC 0.0 0.0 - 0.2 %    Comment: Performed at [facility], [address] [place], [place] [address]  Urinalysis, Routine w reflex microscopic -Urine, Clean Catch     Status: Abnormal   Collection Time: 08/18/22 10:00 AM  Result Value Ref Range   Color, Urine YELLOW YELLOW   APPearance CLEAR CLEAR   Specific Gravity, Urine 1.025 1.005 - 1.030   pH 7.0 5.0 - 8.0   Glucose, UA NEGATIVE NEGATIVE mg/dL   Hgb urine dipstick TRACE (A) NEGATIVE   Bilirubin Urine NEGATIVE NEGATIVE   Ketones, ur NEGATIVE NEGATIVE mg/dL   Protein, ur 621 (A) NEGATIVE mg/dL   Nitrite NEGATIVE NEGATIVE   Leukocytes,Ua NEGATIVE NEGATIVE    Comment: Performed at [facility], [address] [place], [place] [address]  Troponin I (High Sensitivity)     Status: None    Collection Time: 08/18/22 10:00 AM  Result Value Ref Range   Troponin I (High Sensitivity) 6 <18 ng/L    Comment: (NOTE) Elevated high sensitivity troponin I (hsTnI) values and significant  changes across serial measurements may suggest ACS but many other  chronic and acute conditions are known to elevate hsTnI results.  Refer to the "Links" section for chest pain algorithms and additional  guidance. Performed at [facility], [address] [place], [place] [address]   Urinalysis, Microscopic (reflex)     Status: Abnormal   Collection Time: 08/18/22 10:00 AM  Result Value Ref Range   RBC / HPF 0-5 0 - 5 RBC/hpf   WBC, UA 0-5 0 - 5 WBC/hpf   Bacteria, UA FEW (A) NONE SEEN   Squamous Epithelial / HPF 0-5 0 - 5 /HPF   Mucus PRESENT    Amorphous Crystal PRESENT     Comment: Performed at [facility], [address] [place], [place] [address]  Troponin I (High Sensitivity)     Status: None   Collection Time: 08/18/22 11:59 AM  Result Value Ref Range   Troponin I (High Sensitivity) 7 <18 ng/L    Comment: (NOTE) Elevated high sensitivity troponin I (hsTnI) values and significant  changes across serial measurements may suggest ACS but many other  chronic and acute conditions are known to elevate hsTnI results.  Refer to the "Links" section for chest pain algorithms and additional  guidance. Performed at [facility], [address] [place], [place] [address]    US ABDOMEN LIMITED RUQ (LIVER/GB)  Result Date: 08/18/2022 CLINICAL DATA:  Right upper quadrant pain for the past day with nausea and vomiting. EXAM: ULTRASOUND ABDOMEN LIMITED RIGHT UPPER QUADRANT COMPARISON:  CT abdomen pelvis from same day. FINDINGS: Gallbladder: Multiple gallstones and sludge. No wall thickening visualized. Sonographic Murphy sign not assessable due to patient's status. Common bile duct: Diameter: 2 mm, normal. Liver: No focal lesion identified. Increased in parenchymal  echogenicity. Portal vein is patent on color Doppler imaging with normal direction of blood flow towards the liver. Other: None. IMPRESSION: 1. Cholelithiasis without sonographic evidence of acute cholecystitis. 2. Hepatic steatosis. Electronically Signed  By: Obie Dredge M.D.   On: 08/18/2022 12:47   CT ABDOMEN PELVIS W CONTRAST  Result Date: 08/18/2022 CLINICAL DATA:  Abdominal pain, acute, nonlocalized. Symptoms began after eating yesterday. Abdominal pain and vomiting. EXAM: CT ABDOMEN AND PELVIS WITH CONTRAST TECHNIQUE: Multidetector CT imaging of the abdomen and pelvis was performed using the standard protocol following bolus administration of intravenous contrast. RADIATION DOSE REDUCTION: This exam was performed according to the departmental dose-optimization program which includes automated exposure control, adjustment of the mA and/or kV according to patient size and/or use of iterative reconstruction technique. CONTRAST:  OMNIPAQUE IOHEXOL 300 MG/ML  SOLN COMPARISON:  None Available. FINDINGS: Lower chest: Mild scarring at the lung bases right more than left. Small hiatal hernia. Hepatobiliary: Liver parenchyma is normal.  No calcified gallstones. Pancreas: Normal Spleen: Normal Adrenals/Urinary Tract: Adrenal glands are normal. Kidneys are normal. No mass, stone or hydronephrosis. Bladder is normal. Stomach/Bowel: Stomach scratch that small hiatal hernia as noted above. Stomach otherwise normal. No abnormal small bowel finding. Normal appendix. Normal colon. Vascular/Lymphatic: Aortic atherosclerosis. No aneurysm. IVC is normal. No adenopathy. Reproductive: Normal Other: No free fluid or air. Musculoskeletal: Ordinary chronic degenerative changes of the lumbar spine. 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 Degenerative disease of the right hip. IMPRESSION: 1. No acute finding to explain the clinical presentation. 2. Small hiatal hernia. 3. Aortic atherosclerosis. Aortic Atherosclerosis  (ICD10-I70.0). Electronically Signed   By: Paulina Fusi M.D.   On: 08/18/2022 11:49    Pending Labs Unresulted Labs (From admission, onward)     Start     Ordered   08/18/22 1706  HIV Antibody (routine testing w rflx)  (HIV Antibody (Routine testing w reflex) panel)  Once,   R        08/18/22 1705            Vitals/Pain Today's Vitals   08/18/22 1524 08/18/22 1600 08/18/22 1800 08/18/22 1900  BP:  (!) 152/88  138/78  Pulse:  96  95  Resp:  18  (!) 24  Temp:   98.6 F (37 C)   TempSrc:      SpO2:  95%  93%  Weight:      Height:      PainSc: 6        Isolation Precautions No active isolations  Medications Medications  enoxaparin (LOVENOX) injection 40 mg (has no administration in time range)  dextrose 5 % and 0.9 % NaCl with KCl 20 mEq/L infusion (has no administration in time range)  cefTRIAXone (ROCEPHIN) 2 g in sodium chloride 0.9 % 100 mL IVPB (2 g Intravenous New Bag/Given 08/18/22 1759)  HYDROmorphone (DILAUDID) injection 1 mg (1 mg Intravenous Given 08/18/22 1758)  ondansetron (ZOFRAN-ODT) disintegrating tablet 4 mg ( Oral See Alternative 08/18/22 1805)    Or  ondansetron (ZOFRAN) injection 4 mg (4 mg Intravenous Given 08/18/22 1805)  metoprolol tartrate (LOPRESSOR) injection 5 mg (has no administration in time range)  fentaNYL (SUBLIMAZE) injection 50 mcg (50 mcg Intravenous Given 08/18/22 1006)  ondansetron (ZOFRAN) injection 4 mg (4 mg Intravenous Given 08/18/22 1006)  sodium chloride 0.9 % bolus 1,000 mL (0 mLs Intravenous Stopped 08/18/22 1052)  iohexol (OMNIPAQUE) 300 MG/ML solution 100 mL (100 mLs Intravenous Contrast Given 08/18/22 1108)  magnesium sulfate IVPB 2 g 50 mL (0 g Intravenous Stopped 08/18/22 1258)  HYDROmorphone (DILAUDID) injection 0.5 mg (0.5 mg Intravenous Given 08/18/22 1153)  fentaNYL (SUBLIMAZE) injection 50 mcg (50 mcg Intravenous Given 08/18/22 1350)  HYDROmorphone (  DILAUDID) injection 0.5 mg (0.5 mg Intravenous Given 08/18/22 1527)     Mobility non-ambulatory

## 2022-08-18 NOTE — H&P (Signed)
Marcus Wells is an 62 y.o. male.   Chief Complaint: abdominal pain  HPI: 62 yo with 1 day history of RUQ abdominal pain after eating ribs/ chicken last night  Pain is severe , constant and he has had N/V. Gallstones noted on U/S.   Past Medical History:  Diagnosis Date   Hernia, abdominal    Hypertension     History reviewed. No pertinent surgical history.  History reviewed. No pertinent family history. Social History:  reports that he has never smoked. He has never used smokeless tobacco. He reports current alcohol use. He reports that he does not currently use drugs.  Allergies: No Known Allergies  (Not in a hospital admission)   Results for orders placed or performed during the hospital encounter of 08/18/22 (from the past 48 hour(s))  Lipase, blood     Status: None   Collection Time: 08/18/22 10:00 AM  Result Value Ref Range   Lipase 27 11 - 51 U/L    Comment: Performed at [facility], [address] [place], [place] [address]  Comprehensive metabolic panel     Status: Abnormal   Collection Time: 08/18/22 10:00 AM  Result Value Ref Range   Sodium 138 135 - 145 mmol/L   Potassium 3.5 3.5 - 5.1 mmol/L   Chloride 100 98 - 111 mmol/L   CO2 24 22 - 32 mmol/L   Glucose, Bld 166 (H) 70 - 99 mg/dL    Comment: Glucose reference range applies only to samples taken after fasting for at least 8 hours.   BUN 19 8 - 23 mg/dL   Creatinine, Ser 6.04 0.61 - 1.24 mg/dL   Calcium 9.0 8.9 - 54.0 mg/dL   Total Protein 8.1 6.5 - 8.1 g/dL   Albumin 4.6 3.5 - 5.0 g/dL   AST 43 (H) 15 - 41 U/L   ALT 47 (H) 0 - 44 U/L   Alkaline Phosphatase 92 38 - 126 U/L   Total Bilirubin 0.9 0.3 - 1.2 mg/dL   GFR, Estimated >98 >11 mL/min    Comment: (NOTE) Calculated using the CKD-EPI Creatinine Equation (2021)    Anion gap 14 5 - 15    Comment: Performed at [facility], [address] [place], [place] [address]  CBC     Status: Abnormal   Collection Time: 08/18/22 10:00  AM  Result Value Ref Range   WBC 14.4 (H) 4.0 - 10.5 K/uL   RBC 5.32 4.22 - 5.81 MIL/uL   Hemoglobin 15.9 13.0 - 17.0 g/dL   HCT 29.5 62.1 - 30.8 %   MCV 85.3 80.0 - 100.0 fL   MCH 29.9 26.0 - 34.0 pg   MCHC 35.0 30.0 - 36.0 g/dL   RDW 65.7 84.6 - 96.2 %   Platelets 192 150 - 400 K/uL   nRBC 0.0 0.0 - 0.2 %    Comment: Performed at [facility], [address] [place], [place] [address]  Urinalysis, Routine w reflex microscopic -Urine, Clean Catch     Status: Abnormal   Collection Time: 08/18/22 10:00 AM  Result Value Ref Range   Color, Urine YELLOW YELLOW   APPearance CLEAR CLEAR   Specific Gravity, Urine 1.025 1.005 - 1.030   pH 7.0 5.0 - 8.0   Glucose, UA NEGATIVE NEGATIVE mg/dL   Hgb urine dipstick TRACE (A) NEGATIVE   Bilirubin Urine NEGATIVE NEGATIVE   Ketones, ur NEGATIVE NEGATIVE mg/dL   Protein, ur 132 (  A) NEGATIVE mg/dL   Nitrite NEGATIVE NEGATIVE   Leukocytes,Ua NEGATIVE NEGATIVE    Comment: Performed at [facility], [address] [place], [place] [address]  Troponin I (High Sensitivity)     Status: None   Collection Time: 08/18/22 10:00 AM  Result Value Ref Range   Troponin I (High Sensitivity) 6 <18 ng/L    Comment: (NOTE) Elevated high sensitivity troponin I (hsTnI) values and significant  changes across serial measurements may suggest ACS but many other  chronic and acute conditions are known to elevate hsTnI results.  Refer to the "Links" section for chest pain algorithms and additional  guidance. Performed at [facility], [address] [place], [place] [address]   Urinalysis, Microscopic (reflex)     Status: Abnormal   Collection Time: 08/18/22 10:00 AM  Result Value Ref Range   RBC / HPF 0-5 0 - 5 RBC/hpf   WBC, UA 0-5 0 - 5 WBC/hpf   Bacteria, UA FEW (A) NONE SEEN   Squamous Epithelial / HPF 0-5 0 - 5 /HPF   Mucus PRESENT    Amorphous Crystal PRESENT     Comment: Performed at [facility], [address] [place], [place] [address]  Troponin I (High Sensitivity)     Status: None   Collection Time: 08/18/22 11:59 AM  Result Value Ref Range   Troponin I (High Sensitivity) 7 <18 ng/L    Comment: (NOTE) Elevated high sensitivity troponin I (hsTnI) values and significant  changes across serial measurements may suggest ACS but many other  chronic and acute conditions are known to elevate hsTnI results.  Refer to the "Links" section for chest pain algorithms and additional  guidance. Performed at [facility], [address] [place], [place] [address]    US ABDOMEN LIMITED RUQ (LIVER/GB)  Result Date: 08/18/2022 CLINICAL DATA:  Right upper quadrant pain for the past day with nausea and vomiting. EXAM: ULTRASOUND ABDOMEN LIMITED RIGHT UPPER QUADRANT COMPARISON:  CT abdomen pelvis from same day. FINDINGS: Gallbladder: Multiple gallstones and sludge. No wall thickening visualized. Sonographic Murphy sign not assessable due to patient's status. Common bile duct: Diameter: 2 mm, normal. Liver: No focal lesion identified. Increased in parenchymal echogenicity. Portal vein is patent on color Doppler imaging with normal direction of blood flow towards the liver. Other: None. IMPRESSION: 1. Cholelithiasis without sonographic evidence of acute cholecystitis. 2. Hepatic steatosis. Electronically Signed   By: Obie Dredge M.D.   On: 08/18/2022 12:47   CT ABDOMEN PELVIS W CONTRAST  Result Date: 08/18/2022 CLINICAL DATA:  Abdominal pain, acute, nonlocalized. Symptoms began after eating yesterday. Abdominal pain and vomiting. EXAM: CT ABDOMEN AND PELVIS WITH CONTRAST TECHNIQUE: Multidetector CT imaging of the abdomen and pelvis was performed using the standard protocol following bolus administration of intravenous contrast. RADIATION DOSE REDUCTION: This exam was performed according to the departmental dose-optimization program which includes automated exposure control, adjustment of the mA  and/or kV according to patient size and/or use of iterative reconstruction technique. CONTRAST:  OMNIPAQUE IOHEXOL 300 MG/ML  SOLN COMPARISON:  None Available. FINDINGS: Lower chest: Mild scarring at the lung bases right more than left. Small hiatal hernia. Hepatobiliary: Liver parenchyma is normal.  No calcified gallstones. Pancreas: Normal Spleen: Normal Adrenals/Urinary Tract: Adrenal glands are normal. Kidneys are normal. No mass, stone or hydronephrosis. Bladder is normal. Stomach/Bowel: Stomach scratch that small hiatal hernia as noted above. Stomach otherwise normal. No abnormal  small bowel finding. Normal appendix. Normal colon. Vascular/Lymphatic: Aortic atherosclerosis. No aneurysm. IVC is normal. No adenopathy. Reproductive: Normal Other: No free fluid or air. Musculoskeletal: Ordinary chronic degenerative changes of the lumbar spine. 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 Degenerative disease of the right hip. IMPRESSION: 1. No acute finding to explain the clinical presentation. 2. Small hiatal hernia. 3. Aortic atherosclerosis. Aortic Atherosclerosis (ICD10-I70.0). Electronically Signed   By: Paulina Fusi M.D.   On: 08/18/2022 11:49    Review of Systems  Gastrointestinal:  Positive for abdominal pain, nausea and vomiting.  All other systems reviewed and are negative.   Blood pressure (!) 152/88, pulse 96, temperature 98.6 F (37 C), temperature source Oral, resp. rate 18, height 5\' 6"  (1.676 m), weight 102.1 kg, SpO2 95%. Physical Exam HENT:     Head: Normocephalic.  Abdominal:     Tenderness: There is abdominal tenderness in the right upper quadrant. There is no guarding or rebound. Positive signs include Murphy's sign.     Hernia: No hernia is present.  Musculoskeletal:        General: Normal range of motion.  Skin:    General: Skin is warm.  Neurological:     General: No focal deficit present.     Mental Status: He is alert.  Psychiatric:        Mood and Affect: Mood  normal.      Assessment/Plan Acute cholecystitis - Admit  IVF, analgesia, abx     Plan lap chole in AM with Dr Magnus Ivan- LDOW    The procedure has been discussed with the patient. Operative and non operative treatments have been discussed. Risks of surgery include bleeding, infection,  Common bile duct injury,  Injury to the stomach,liver, colon,small intestine, abdominal wall,  Diaphragm,  Major blood vessels,  And the need for an open procedure.  Other risks include worsening of medical problems, death,  DVT and pulmonary embolism, and cardiovascular events.   Medical options have also been discussed. The patient has been informed of long term expectations of surgery and non surgical options,  The patient agrees to proceed.    Dortha Schwalbe, MD 08/18/2022, 4:55 PM  HIGH COMPLEXITY

## 2022-08-18 NOTE — ED Triage Notes (Signed)
The patient stated after eating last night he started having upper ABD pain and vomiting. He denied fever.

## 2022-08-18 NOTE — Plan of Care (Signed)

## 2022-08-18 NOTE — ED Provider Notes (Signed)
Accepted handoff from interdepartmental transfer from [facility]. Please see prior provider note for more detail.   Briefly: Patient is 62 y.o.   DDX: concern for abdominal pain, nausea. Transferred to discuss symptomatic gallstones with surgeon Dr Luisa Hart, patient still having persistent 6/10 pain on re-evaluation. Consulted Dr. Luisa Hart who after evaluating the patient will admit for surgery.  Plan: admit to Dr. Luisa Hart for cholecystectomy for symptomatic gallstones     [place] 08/18/22 1712    Jacalyn Lefevre, MD 08/18/22 2032

## 2022-08-18 NOTE — ED Notes (Signed)
Left in care of [facility] en route to [facility] ED.

## 2022-08-19 ENCOUNTER — Encounter (HOSPITAL_COMMUNITY)
Admission: EM | Disposition: A | Discharge status: Home / Self Care | Payer: Self-pay | Source: Home / Self Care | Attending: Emergency Medicine

## 2022-08-19 ENCOUNTER — Other Ambulatory Visit: Payer: Self-pay

## 2022-08-19 ENCOUNTER — Inpatient Hospital Stay (HOSPITAL_COMMUNITY): Payer: BLUE CROSS/BLUE SHIELD | Admitting: Registered Nurse

## 2022-08-19 ENCOUNTER — Encounter (HOSPITAL_COMMUNITY): Payer: Self-pay

## 2022-08-19 DIAGNOSIS — K8000 Calculus of gallbladder with acute cholecystitis without obstruction: Secondary | ICD-10-CM | POA: Diagnosis present

## 2022-08-19 HISTORY — PX: CHOLECYSTECTOMY: SHX55

## 2022-08-19 LAB — MRSA NEXT GEN BY PCR, NASAL: MRSA by PCR Next Gen: NOT DETECTED

## 2022-08-19 SURGERY — LAPAROSCOPIC CHOLECYSTECTOMY
Anesthesia: General | Site: Abdomen

## 2022-08-19 MED ORDER — DEXAMETHASONE SODIUM PHOSPHATE 10 MG/ML IJ SOLN
INTRAMUSCULAR | Status: AC
  Filled 2022-08-19: qty 1

## 2022-08-19 MED ORDER — LIDOCAINE 2% (20 MG/ML) 5 ML SYRINGE
INTRAMUSCULAR | Status: DC | PRN
  Administered 2022-08-19: 80 mg via INTRAVENOUS

## 2022-08-19 MED ORDER — BUPIVACAINE HCL (PF) 0.5 % IJ SOLN
INTRAMUSCULAR | Status: DC | PRN
  Administered 2022-08-19: 20 mL

## 2022-08-19 MED ORDER — ONDANSETRON HCL 4 MG/2ML IJ SOLN
INTRAMUSCULAR | Status: AC
  Filled 2022-08-19: qty 2

## 2022-08-19 MED ORDER — ACETAMINOPHEN 10 MG/ML IV SOLN
INTRAVENOUS | Status: AC
  Filled 2022-08-19: qty 100

## 2022-08-19 MED ORDER — HYDROMORPHONE HCL 1 MG/ML IJ SOLN: 0.2500 mg | INTRAMUSCULAR | Status: DC | PRN

## 2022-08-19 MED ORDER — DEXAMETHASONE SODIUM PHOSPHATE 10 MG/ML IJ SOLN
INTRAMUSCULAR | Status: DC | PRN
  Administered 2022-08-19: 8 mg via INTRAVENOUS

## 2022-08-19 MED ORDER — OXYCODONE HCL 5 MG PO TABS: 5.0000 mg | ORAL_TABLET | Freq: Once | ORAL | Status: AC | PRN

## 2022-08-19 MED ORDER — SUGAMMADEX SODIUM 200 MG/2ML IV SOLN
INTRAVENOUS | Status: DC | PRN
  Administered 2022-08-19: 200 mg via INTRAVENOUS

## 2022-08-19 MED ORDER — PROPOFOL 10 MG/ML IV BOLUS
INTRAVENOUS | Status: DC | PRN
Start: 2022-08-19 — End: 2022-08-19
  Administered 2022-08-19: 50 mg via INTRAVENOUS
  Administered 2022-08-19: 150 mg via INTRAVENOUS

## 2022-08-19 MED ORDER — OXYCODONE HCL 5 MG PO TABS
ORAL_TABLET | ORAL | Status: AC
  Administered 2022-08-19: 5 mg via ORAL
  Filled 2022-08-19: qty 1

## 2022-08-19 MED ORDER — ROCURONIUM BROMIDE 10 MG/ML (PF) SYRINGE
PREFILLED_SYRINGE | INTRAVENOUS | Status: DC | PRN
  Administered 2022-08-19: 50 mg via INTRAVENOUS

## 2022-08-19 MED ORDER — MIDAZOLAM HCL 5 MG/5ML IJ SOLN
INTRAMUSCULAR | Status: DC | PRN
  Administered 2022-08-19: 2 mg via INTRAVENOUS

## 2022-08-19 MED ORDER — MIDAZOLAM HCL 2 MG/2ML IJ SOLN
INTRAMUSCULAR | Status: AC
  Filled 2022-08-19: qty 2

## 2022-08-19 MED ORDER — 0.9 % SODIUM CHLORIDE (POUR BTL) OPTIME
TOPICAL | Status: DC | PRN
  Administered 2022-08-19: 1000 mL

## 2022-08-19 MED ORDER — OXYCODONE HCL 5 MG/5ML PO SOLN: 5.0000 mg | Freq: Once | ORAL | Status: AC | PRN

## 2022-08-19 MED ORDER — LACTATED RINGERS IR SOLN
Status: DC | PRN
  Administered 2022-08-19 (×2): 1000 mL

## 2022-08-19 MED ORDER — MORPHINE SULFATE (PF) 2 MG/ML IV SOLN: 2.0000 mg | INTRAVENOUS | Status: DC | PRN

## 2022-08-19 MED ORDER — PROMETHAZINE HCL 25 MG/ML IJ SOLN: 6.2500 mg | INTRAMUSCULAR | Status: DC | PRN

## 2022-08-19 MED ORDER — AMISULPRIDE (ANTIEMETIC) 5 MG/2ML IV SOLN: 10.0000 mg | Freq: Once | INTRAVENOUS | Status: DC | PRN

## 2022-08-19 MED ORDER — ACETAMINOPHEN 10 MG/ML IV SOLN
1000.0000 mg | Freq: Once | INTRAVENOUS | Status: DC | PRN
  Administered 2022-08-19: 1000 mg via INTRAVENOUS

## 2022-08-19 MED ORDER — FENTANYL CITRATE (PF) 100 MCG/2ML IJ SOLN
INTRAMUSCULAR | Status: DC | PRN
  Administered 2022-08-19 (×2): 25 ug via INTRAVENOUS
  Administered 2022-08-19: 50 ug via INTRAVENOUS

## 2022-08-19 MED ORDER — LISINOPRIL 10 MG PO TABS
10.0000 mg | ORAL_TABLET | Freq: Every day | ORAL | Status: DC
  Administered 2022-08-20: 10 mg via ORAL
  Filled 2022-08-19: qty 1

## 2022-08-19 MED ORDER — LACTATED RINGERS IV SOLN: INTRAVENOUS | Status: DC

## 2022-08-19 MED ORDER — MEPERIDINE HCL 50 MG/ML IJ SOLN: 6.2500 mg | INTRAMUSCULAR | Status: DC | PRN

## 2022-08-19 MED ORDER — ONDANSETRON HCL 4 MG/2ML IJ SOLN
INTRAMUSCULAR | Status: DC | PRN
  Administered 2022-08-19: 4 mg via INTRAVENOUS

## 2022-08-19 MED ORDER — ACETAMINOPHEN 500 MG PO TABS
1000.0000 mg | ORAL_TABLET | Freq: Three times a day (TID) | ORAL | Status: DC
  Administered 2022-08-19 – 2022-08-20 (×2): 1000 mg via ORAL
  Filled 2022-08-19 (×2): qty 2

## 2022-08-19 MED ORDER — PROPOFOL 10 MG/ML IV BOLUS
INTRAVENOUS | Status: AC
  Filled 2022-08-19: qty 20

## 2022-08-19 MED ORDER — LIDOCAINE HCL (PF) 2 % IJ SOLN
INTRAMUSCULAR | Status: AC
  Filled 2022-08-19: qty 5

## 2022-08-19 MED ORDER — FENTANYL CITRATE (PF) 100 MCG/2ML IJ SOLN
INTRAMUSCULAR | Status: AC
  Filled 2022-08-19: qty 2

## 2022-08-19 MED ORDER — OXYCODONE HCL 5 MG PO TABS: 5.0000 mg | ORAL_TABLET | ORAL | Status: DC | PRN

## 2022-08-19 MED ORDER — IBUPROFEN 200 MG PO TABS
400.0000 mg | ORAL_TABLET | Freq: Three times a day (TID) | ORAL | Status: DC
  Administered 2022-08-19 – 2022-08-20 (×3): 400 mg via ORAL
  Filled 2022-08-19 (×3): qty 2

## 2022-08-19 MED ORDER — BUPIVACAINE HCL (PF) 0.5 % IJ SOLN
INTRAMUSCULAR | Status: AC
  Filled 2022-08-19: qty 30

## 2022-08-19 MED ORDER — ROCURONIUM BROMIDE 10 MG/ML (PF) SYRINGE
PREFILLED_SYRINGE | INTRAVENOUS | Status: AC
  Filled 2022-08-19: qty 10

## 2022-08-19 MED ORDER — HEMOSTATIC AGENTS (NO CHARGE) OPTIME
TOPICAL | Status: DC | PRN
  Administered 2022-08-19 (×2): 1 via TOPICAL

## 2022-08-19 MED ORDER — PIPERACILLIN-TAZOBACTAM 3.375 G IVPB
3.3750 g | Freq: Three times a day (TID) | INTRAVENOUS | Status: DC
  Administered 2022-08-19 – 2022-08-20 (×3): 3.375 g via INTRAVENOUS
  Filled 2022-08-19 (×3): qty 50

## 2022-08-19 MED ORDER — METHOCARBAMOL 500 MG PO TABS: 500.0000 mg | ORAL_TABLET | Freq: Three times a day (TID) | ORAL | Status: DC | PRN

## 2022-08-19 SURGICAL SUPPLY — 35 items
ADH SKN CLS APL DERMABOND .7 (GAUZE/BANDAGES/DRESSINGS) ×1
APL PRP STRL LF DISP 70% ISPRP (MISCELLANEOUS) ×1
APPLIER CLIP 5 13 M/L LIGAMAX5 (MISCELLANEOUS) ×1
APR CLP MED LRG 5 ANG JAW (MISCELLANEOUS) ×1
BAG COUNTER SPONGE SURGICOUNT (BAG) IMPLANT
BAG SPNG CNTER NS LX DISP (BAG)
CABLE HIGH FREQUENCY MONO STRZ (ELECTRODE) ×2 IMPLANT
CHLORAPREP W/TINT 26 (MISCELLANEOUS) ×2 IMPLANT
CLIP APPLIE 5 13 M/L LIGAMAX5 (MISCELLANEOUS) ×2 IMPLANT
COVER MAYO STAND XLG (MISCELLANEOUS) ×2 IMPLANT
DERMABOND ADVANCED .7 DNX12 (GAUZE/BANDAGES/DRESSINGS) ×2 IMPLANT
DRAPE C-ARM 42X120 X-RAY (DRAPES) IMPLANT
ELECT REM PT RETURN 15FT ADLT (MISCELLANEOUS) ×2 IMPLANT
GLOVE BIO SURGEON STRL SZ7.5 (GLOVE) ×2 IMPLANT
GOWN STRL REUS W/ TWL XL LVL3 (GOWN DISPOSABLE) ×4 IMPLANT
GOWN STRL REUS W/TWL XL LVL3 (GOWN DISPOSABLE) ×2
HEMOSTAT SURGICEL 4X8 (HEMOSTASIS) IMPLANT
IRRIG SUCT STRYKERFLOW 2 WTIP (MISCELLANEOUS) ×1
IRRIGATION SUCT STRKRFLW 2 WTP (MISCELLANEOUS) ×2 IMPLANT
KIT BASIN OR (CUSTOM PROCEDURE TRAY) ×2 IMPLANT
KIT TURNOVER KIT A (KITS) IMPLANT
PENCIL SMOKE EVACUATOR (MISCELLANEOUS) IMPLANT
SCISSORS LAP 5X35 DISP (ENDOMECHANICALS) ×2 IMPLANT
SET CHOLANGIOGRAPH MIX (MISCELLANEOUS) IMPLANT
SET TUBE SMOKE EVAC HIGH FLOW (TUBING) ×2 IMPLANT
SLEEVE Z-THREAD 5X100MM (TROCAR) ×4 IMPLANT
SPIKE FLUID TRANSFER (MISCELLANEOUS) ×2 IMPLANT
SUT MNCRL AB 4-0 PS2 18 (SUTURE) ×2 IMPLANT
SYS BAG RETRIEVAL 10MM (BASKET) ×1
SYSTEM BAG RETRIEVAL 10MM (BASKET) ×2 IMPLANT
TOWEL OR 17X26 10 PK STRL BLUE (TOWEL DISPOSABLE) ×2 IMPLANT
TOWEL OR NON WOVEN STRL DISP B (DISPOSABLE) ×2 IMPLANT
TRAY LAPAROSCOPIC (CUSTOM PROCEDURE TRAY) ×2 IMPLANT
TROCAR BALLN 12MMX100 BLUNT (TROCAR) ×2 IMPLANT
TROCAR Z-THREAD OPTICAL 5X100M (TROCAR) ×2 IMPLANT

## 2022-08-19 NOTE — Op Note (Signed)
LAPAROSCOPIC CHOLECYSTECTOMY  Procedure Note  Marcus Wells 08/18/2022 - 08/19/2022   Pre-op Diagnosis: ACUTE CHOLECYSTITIS WITH CHOLELITHIASIS     Post-op Diagnosis: SAME  Procedure(s): LAPAROSCOPIC CHOLECYSTECTOMY  Surgeon(s): Abigail Miyamoto, MD  Anesthesia: General  Staff:  Circulator: Selena Lesser, RN Scrub Person: Shanon Brow, Jamie H, CST  Estimated Blood Loss: less than 50 mL               Specimens: sent to path  Findings: The patient was found to have severe acute cholecystitis with gallstones.  There were patches of ischemia and necrosis within the wall of the gallbladder as well.  Procedure: The patient is brought to the operating room and identified as correct patient.  He was placed upon on the operating table general anesthesia was induced.  His abdomen was then prepped and draped in the usual sterile fashion.  I made small vertical incision below the umbilicus with a scalpel.  I carried this down to the fascia which was then opened the scalpel as well.  A Kelly clamp was then used to gain entrance the peritoneal cavity under direct vision.  A 0 Vicryl pursing suture was placed around the fascial opening.  The [facility] port was placed at the opening and insufflation of the abdomen was began.  I then placed a 5 mm trocar in the patient's epigastrium and 2 more in the right upper quadrant all under direct vision.  The gallbladder send to be distended and intensely inflamed with patches of necrosis and gangrene.  I did make a small hole in the gallbladder and aspirate bile from the gallbladder which appeared hydropic.  I was then able to dissect out the base the gallbladder.  The cystic duct was identified and a critical window was achieved around it.  I clipped 3 times proximally once distally and then transected it.  The cystic artery was identified and clipped proximally distally and transected as well.  The gallbladder was then slowly dissected free from  the liver bed with electrocautery.  Once it was free from the liver bed I was able to achieve hemostasis in the liver bed with the cautery and surgical snow.  The gallbladder was then placed in an Endosac and removed through the incision at the umbilicus.  I then again copiously irrigated the abdomen with saline.  Hemostasis appeared to be achieved.  I then tied the 0 Vicryl at the umbilicus and placed closing the fascial defect.  All ports were then removed under direct vision and the abdomen was deflated.  All incisions were then anesthetized with Marcaine and closed with 4-0 Monocryl sutures.  Dermabond was then applied.  The patient tolerated the procedure well.  All the counts were correct at the end of the procedure.  The patient was then extubated in the operating room and taken in a stable condition to the recovery room.          Abigail Miyamoto   Date: 08/19/2022  Time: 1:06 PM

## 2022-08-19 NOTE — Anesthesia Procedure Notes (Signed)
Procedure Name: Intubation Date/Time: 08/19/2022 12:17 PM  Performed by: Elisabeth Cara, CRNAPre-anesthesia Checklist: Patient identified, Emergency Drugs available, Suction available, Patient being monitored and Timeout performed Patient Re-evaluated:Patient Re-evaluated prior to induction Oxygen Delivery Method: Circle system utilized Preoxygenation: Pre-oxygenation with 100% oxygen Induction Type: IV induction Ventilation: Mask ventilation without difficulty Laryngoscope Size: Mac and 4 Grade View: Grade III Tube type: Oral Tube size: 7.5 mm Number of attempts: 2 Airway Equipment and Method: Bougie stylet Placement Confirmation: ETT inserted through vocal cords under direct vision, breath sounds checked- equal and bilateral and positive ETCO2 Secured at: 23 cm Tube secured with: Tape Dental Injury: Teeth and Oropharynx as per pre-operative assessment  Difficulty Due To: Difficulty was unanticipated, Difficult Airway- due to large tongue and Difficult Airway- due to anterior larynx Comments: SMooth IV induction. Easy mask. DL X 1 . Grade 3 view by CRNA. DL  X 1 by Dr Hyacinth Meeker with Mac 4. Grade 3 view. Unable to pass ETT. Head repositioned. DL X 1 by Dr Hyacinth Meeker with Mac 4. Grade 3 view. Boogie passed with ease. + ETCO2. BBS=. ATOI. ETT secured.

## 2022-08-19 NOTE — Progress Notes (Signed)
Patient ID: Marcus Wells, male   DOB: 04-21-1960, 62 y.o.   MRN: [id number]  Pre Procedure note for inpatients:   Marcus Wells has been scheduled for Procedure(s): LAPAROSCOPIC CHOLECYSTECTOMY (N/A) today. The various methods of treatment have been discussed with the patient. After consideration of the risks, benefits and treatment options the patient has consented to the planned procedure.   I discussed the procedure in detail. .  We discussed the risks and benefits of a laparoscopic cholecystectomy  including, but not limited to bleeding, infection, injury to surrounding structures such as the intestine or liver, bile leak, retained gallstones, need to convert to an open procedure, prolonged diarrhea, blood clots such as  DVT, common bile duct injury, anesthesia risks, and possible need for additional procedures.  The likelihood of improvement in symptoms and return to the patient's normal status is good. We discussed the typical post-operative recovery course.   The patient has been seen and labs reviewed. There are no changes in the patient's condition to prevent proceeding with the planned procedure today.  Recent labs:  Lab Results  Component Value Date   WBC 14.4 (H) 08/18/2022   HGB 15.9 08/18/2022   HCT 45.4 08/18/2022   PLT 192 08/18/2022   GLUCOSE 166 (H) 08/18/2022   ALT 47 (H) 08/18/2022   AST 43 (H) 08/18/2022   NA 138 08/18/2022   K 3.5 08/18/2022   CL 100 08/18/2022   CREATININE 0.89 08/18/2022   BUN 19 08/18/2022   CO2 24 08/18/2022    Marcus Miyamoto, MD 08/19/2022 7:45 AM

## 2022-08-19 NOTE — Discharge Instructions (Signed)

## 2022-08-19 NOTE — Progress Notes (Signed)
CCC Pre-op Review  Pre-op checklist: asked floor nurse to complete  NPO: order placed at 0806 per anesthesia protocol  Labs: WNL  Consent: order in place, asked nurse to obtain  H&P: completed  Vitals: WNL  O2 requirements: 3LNC  MAR/PTA review: completed Recephin ordered [facility] w/20K IVF  IV: 20g RAC  Floor nurse name:  [place] LPN 3W  Additional info: NA

## 2022-08-19 NOTE — Transfer of Care (Signed)
Immediate Anesthesia Transfer of Care Note  Patient: Marcus Wells  Procedure(s) Performed: LAPAROSCOPIC CHOLECYSTECTOMY (Abdomen)  Patient Location: PACU  Anesthesia Type:General  Level of Consciousness: awake, alert , oriented, and patient cooperative  Airway & Oxygen Therapy: Patient Spontanous Breathing and Patient connected to face mask oxygen  Post-op Assessment: Report given to RN, Post -op Vital signs reviewed and stable, and Patient moving all extremities  Post vital signs: Reviewed and stable  Last Vitals:  Vitals Value Taken Time  BP 130/74 08/19/22 1323  Temp 39.1 C 08/19/22 1323  Pulse 81 08/19/22 1325  Resp 27 08/19/22 1325  SpO2 98 % 08/19/22 1325  Vitals shown include unfiled device data.  Last Pain:  Vitals:   08/19/22 1146  TempSrc: Oral  PainSc:       Patients Stated Pain Goal: 2 (08/19/22 0505)  Complications:  Encounter Notable Events  Notable Event Outcome Phase Comment  Difficult to intubate - unexpected  Intraprocedure Filed from anesthesia note documentation.

## 2022-08-19 NOTE — Anesthesia Postprocedure Evaluation (Signed)
Anesthesia Post Note  Patient: Marcus Wells  Procedure(s) Performed: LAPAROSCOPIC CHOLECYSTECTOMY (Abdomen)     Patient location during evaluation: PACU Anesthesia Type: General Level of consciousness: awake and alert Pain management: pain level controlled Vital Signs Assessment: post-procedure vital signs reviewed and stable Respiratory status: spontaneous breathing, nonlabored ventilation and respiratory function stable Cardiovascular status: blood pressure returned to baseline and stable Postop Assessment: no apparent nausea or vomiting Anesthetic complications: yes   Encounter Notable Events  Notable Event Outcome Phase Comment  Difficult to intubate - unexpected  Intraprocedure Filed from anesthesia note documentation.    Last Vitals:  Vitals:   08/19/22 1400 08/19/22 1415  BP: 131/71 132/77  Pulse: 76 72  Resp: (!) 24 20  Temp:    SpO2: 93% 94%    Last Pain:  Vitals:   08/19/22 1415  TempSrc:   PainSc: 0-No pain                 Lowella Curb

## 2022-08-19 NOTE — Anesthesia Preprocedure Evaluation (Signed)
Anesthesia Evaluation  Patient identified by MRN, date of birth, ID band Patient awake    Reviewed: Allergy & Precautions, H&P , NPO status , Patient's Chart, lab work & pertinent test results  Airway Mallampati: II  TM Distance: >3 FB Neck ROM: Full    Dental no notable dental hx.    Pulmonary neg pulmonary ROS   Pulmonary exam normal breath sounds clear to auscultation       Cardiovascular hypertension, negative cardio ROS Normal cardiovascular exam Rhythm:Regular Rate:Normal     Neuro/Psych negative neurological ROS  negative psych ROS   GI/Hepatic negative GI ROS, Neg liver ROS,,,  Endo/Other  negative endocrine ROS    Renal/GU negative Renal ROS  negative genitourinary   Musculoskeletal negative musculoskeletal ROS (+)    Abdominal  (+) + obese  Peds negative pediatric ROS (+)  Hematology negative hematology ROS (+)   Anesthesia Other Findings   Reproductive/Obstetrics negative OB ROS                             Anesthesia Physical Anesthesia Plan  ASA: 2  Anesthesia Plan: General   Post-op Pain Management: Dilaudid IV   Induction: Intravenous  PONV Risk Score and Plan: 2 and Ondansetron, Midazolam and Treatment may vary due to age or medical condition  Airway Management Planned: Oral ETT  Additional Equipment:   Intra-op Plan:   Post-operative Plan: Extubation in OR  Informed Consent: I have reviewed the patients History and Physical, chart, labs and discussed the procedure including the risks, benefits and alternatives for the proposed anesthesia with the patient or authorized representative who has indicated his/her understanding and acceptance.     Dental advisory given  Plan Discussed with: CRNA  Anesthesia Plan Comments:        Anesthesia Quick Evaluation

## 2022-08-19 NOTE — Plan of Care (Signed)

## 2022-08-20 ENCOUNTER — Encounter (HOSPITAL_COMMUNITY): Payer: Self-pay | Admitting: Surgery

## 2022-08-20 LAB — HIV ANTIBODY (ROUTINE TESTING W REFLEX): HIV Screen 4th Generation wRfx: NONREACTIVE

## 2022-08-20 MED ORDER — ACETAMINOPHEN 500 MG PO TABS: 1000.0000 mg | ORAL_TABLET | Freq: Three times a day (TID) | ORAL | Status: AC | PRN

## 2022-08-20 MED ORDER — METHOCARBAMOL 500 MG PO TABS: 500.0000 mg | ORAL_TABLET | Freq: Three times a day (TID) | ORAL | 0 refills | Status: AC | PRN

## 2022-08-20 MED ORDER — OXYCODONE HCL 5 MG PO TABS: 5.0000 mg | ORAL_TABLET | Freq: Four times a day (QID) | ORAL | 0 refills | Status: AC | PRN

## 2022-08-20 NOTE — Discharge Summary (Signed)
    Patient ID: SOSA PACIFICO [id number] Jan 02, 1961 62 y.o.  Admit date: 08/18/2022 Discharge date: 08/20/2022  Discharge Diagnosis Acute Cholecystitis s/p Laparoscopic Cholecystectomy by Dr. Magnus Ivan on 08/19/22  Consultants None  H&P 62 yo with 1 day history of RUQ abdominal pain after eating ribs/ chicken last night  Pain is severe , constant and he has had N/V. Gallstones noted on U/S.   Procedures Dr. Magnus Ivan - 08/19/22 - Laparoscopic Cholecystectomy   Hospital Course:  The patient was admitted and underwent a laparoscopic cholecystectomy.  The patient tolerated the procedure well.  On POD 1, the patient was tolerating a diet, voiding well, mobilizing, and pain was controlled with oral pain medications.  The patient was stable for DC home at this time with appropriate follow up made. Discussed discharge instructions, restrictions and return/call back precautions. A note was provided for work.   Physical Exam: Gen:  Alert, NAD, pleasant Card:  Reg rate Pulm:  CTAB, no W/R/R, effort normal Abd: Soft, no distension, appropriately tender around laparoscopic incisions, no rigidity or guarding and otherwise NT, +BS. Incisions with glue intact appears well and are without drainage, bleeding, or signs of infection  Psych: A&Ox3   Allergies as of 08/20/2022   No Known Allergies      Medication List     TAKE these medications    acetaminophen 500 MG tablet Commonly known as: TYLENOL Take 2 tablets (1,000 mg total) by mouth every 8 (eight) hours as needed.   lisinopril 10 MG tablet Commonly known as: ZESTRIL Take 10 mg by mouth daily.   methocarbamol 500 MG tablet Commonly known as: ROBAXIN Take 1 tablet (500 mg total) by mouth every 8 (eight) hours as needed for muscle spasms.   oxyCODONE 5 MG immediate release tablet Commonly known as: Oxy IR/ROXICODONE Take 1 tablet (5 mg total) by mouth every 6 (six) hours as needed for breakthrough pain.          Follow-up  Information     Kayin Osment, Hedda Slade, [place]. Go to.   Specialty: General Surgery Why: Please call our office to confirm your appointment date and time. Our office is scheduling you for post-operative follow up. Please arrive 30 minutes prior to your appointment for paperwork. Please bring a copy of your photo ID and insurance card. Contact information: [address] [place] [address] [phone]                 Signed: Leary Roca, [facility] 08/20/2022, 10:56 AM Please see Amion for pager number during day hours 7:00am-4:30pm

## 2022-08-20 NOTE — Plan of Care (Signed)

## 2022-08-20 NOTE — Progress Notes (Signed)
Provided discharge education/instructions, all questions and concerns addressed. Pt not in any distress, discharged home with belongings accompanied by his wife.

## 2022-08-20 NOTE — Progress Notes (Signed)
   08/20/22 1101  TOC Brief Assessment  Insurance and Status Reviewed  Patient has primary care physician No  Home environment has been reviewed Resides with spouse  Prior level of function: Independent at baseline  Prior/Current Home Services No current home services  Social Determinants of Health Reivew SDOH reviewed no interventions necessary  Readmission risk has been reviewed Yes  Transition of care needs no transition of care needs at this time
